# Patient Record
Sex: Male | Born: 1970 | Race: Black or African American | Hispanic: No | Marital: Married | Smoking: Current every day smoker
Health system: Southern US, Community
[De-identification: ages and names within clinical notes are randomized; demographics above are authoritative.]

## PROBLEM LIST (undated history)

## (undated) ENCOUNTER — Emergency Department (HOSPITAL_COMMUNITY): Admission: EM | Payer: No Typology Code available for payment source | Source: Home / Self Care

## (undated) DIAGNOSIS — M109 Gout, unspecified: Secondary | ICD-10-CM

---

## 2004-10-20 ENCOUNTER — Emergency Department (HOSPITAL_COMMUNITY): Admission: EM | Admit: 2004-10-20 | Discharge: 2004-10-21 | Payer: Self-pay | Admitting: Emergency Medicine

## 2004-12-20 ENCOUNTER — Ambulatory Visit: Payer: Self-pay | Admitting: Cardiovascular Disease

## 2004-12-20 ENCOUNTER — Ambulatory Visit (HOSPITAL_COMMUNITY): Admission: RE | Admit: 2004-12-20 | Discharge: 2004-12-20 | Payer: Self-pay | Admitting: Interventional Cardiology

## 2004-12-21 ENCOUNTER — Emergency Department (HOSPITAL_COMMUNITY): Admission: EM | Admit: 2004-12-21 | Discharge: 2004-12-21 | Payer: Self-pay | Admitting: Emergency Medicine

## 2010-03-31 ENCOUNTER — Encounter: Payer: Self-pay | Admitting: Interventional Cardiology

## 2011-04-26 ENCOUNTER — Emergency Department (HOSPITAL_COMMUNITY): Payer: Private Health Insurance - Indemnity

## 2011-04-26 ENCOUNTER — Emergency Department (HOSPITAL_COMMUNITY)
Admission: EM | Admit: 2011-04-26 | Discharge: 2011-04-26 | Disposition: A | Discharge status: Home / Self Care | Payer: Private Health Insurance - Indemnity | Attending: Emergency Medicine | Admitting: Emergency Medicine

## 2011-04-26 ENCOUNTER — Encounter (HOSPITAL_COMMUNITY): Payer: Self-pay | Admitting: Emergency Medicine

## 2011-04-26 ENCOUNTER — Ambulatory Visit (INDEPENDENT_AMBULATORY_CARE_PROVIDER_SITE_OTHER): Payer: PRIVATE HEALTH INSURANCE | Admitting: Family Medicine

## 2011-04-26 ENCOUNTER — Other Ambulatory Visit: Payer: Self-pay

## 2011-04-26 VITALS — BP 120/80 | HR 83 | Temp 99.2°F

## 2011-04-26 DIAGNOSIS — M25539 Pain in unspecified wrist: Secondary | ICD-10-CM

## 2011-04-26 DIAGNOSIS — R05 Cough: Secondary | ICD-10-CM

## 2011-04-26 DIAGNOSIS — M79609 Pain in unspecified limb: Secondary | ICD-10-CM | POA: Insufficient documentation

## 2011-04-26 DIAGNOSIS — M79602 Pain in left arm: Secondary | ICD-10-CM

## 2011-04-26 DIAGNOSIS — R61 Generalized hyperhidrosis: Secondary | ICD-10-CM

## 2011-04-26 DIAGNOSIS — R0602 Shortness of breath: Secondary | ICD-10-CM | POA: Insufficient documentation

## 2011-04-26 DIAGNOSIS — M2540 Effusion, unspecified joint: Secondary | ICD-10-CM

## 2011-04-26 DIAGNOSIS — F17200 Nicotine dependence, unspecified, uncomplicated: Secondary | ICD-10-CM | POA: Insufficient documentation

## 2011-04-26 DIAGNOSIS — R059 Cough, unspecified: Secondary | ICD-10-CM

## 2011-04-26 DIAGNOSIS — R110 Nausea: Secondary | ICD-10-CM

## 2011-04-26 DIAGNOSIS — M25439 Effusion, unspecified wrist: Secondary | ICD-10-CM | POA: Insufficient documentation

## 2011-04-26 LAB — BASIC METABOLIC PANEL
BUN: 10 mg/dL (ref 6–23)
CO2: 24 mEq/L (ref 19–32)
Calcium: 9.9 mg/dL (ref 8.4–10.5)
Chloride: 103 mEq/L (ref 96–112)
Creatinine, Ser: 0.68 mg/dL (ref 0.50–1.35)
GFR calc Af Amer: >90 mL/min (ref >90)
GFR calc non Af Amer: >90 mL/min (ref >90)
Glucose, Bld: 116 mg/dL — ABNORMAL HIGH (ref 70–99)
Potassium: 4.6 mEq/L (ref 3.5–5.1)
Sodium: 140 mEq/L (ref 135–145)

## 2011-04-26 LAB — CARDIAC PANEL(CRET KIN+CKTOT+MB+TROPI)
CK, MB: 2.1 ng/mL (ref 0.3–4.0)
Relative Index: 2.1 (ref 0.0–2.5)
Total CK: 102 U/L (ref 7–232)
Troponin I: <0.3 ng/mL (ref <0.30)

## 2011-04-26 LAB — CBC
HCT: 42.2 % (ref 39.0–52.0)
Hemoglobin: 15 g/dL (ref 13.0–17.0)
MCH: 29.8 pg (ref 26.0–34.0)
MCHC: 35.5 g/dL (ref 30.0–36.0)
MCV: 83.9 fL (ref 78.0–100.0)
Platelets: 296 10*3/uL (ref 150–400)
RBC: 5.03 MIL/uL (ref 4.22–5.81)
RDW: 13 % (ref 11.5–15.5)
WBC: 17.1 10*3/uL — ABNORMAL HIGH (ref 4.0–10.5)

## 2011-04-26 LAB — D-DIMER, QUANTITATIVE: D-Dimer, Quant: <0.22 ug/mL-FEU (ref 0.00–0.48)

## 2011-04-26 MED ORDER — LORAZEPAM 2 MG/ML IJ SOLN
2.0000 mg | Freq: Once | INTRAMUSCULAR | Status: AC
  Administered 2011-04-26: 2 mg via INTRAVENOUS
  Filled 2011-04-26: qty 1

## 2011-04-26 MED ORDER — HYDROMORPHONE HCL PF 1 MG/ML IJ SOLN
1.0000 mg | Freq: Once | INTRAMUSCULAR | Status: AC
  Administered 2011-04-26: 1 mg via INTRAVENOUS
  Filled 2011-04-26: qty 1

## 2011-04-26 MED ORDER — MORPHINE SULFATE 2 MG/ML IJ SOLN
2.0000 mg | Freq: Once | INTRAMUSCULAR | Status: AC
  Administered 2011-04-26: 2 mg via INTRAVENOUS
  Filled 2011-04-26: qty 1

## 2011-04-26 MED ORDER — HYDROMORPHONE BOLUS VIA INFUSION: 1.0000 mg | Freq: Once | INTRAVENOUS | Status: DC

## 2011-04-26 MED ORDER — SODIUM CHLORIDE 0.9 % IV BOLUS (SEPSIS)
1000.0000 mL | Freq: Once | INTRAVENOUS | Status: AC
  Administered 2011-04-26: 1000 mL via INTRAVENOUS

## 2011-04-26 MED ORDER — NAPROXEN 500 MG PO TABS: 500.0000 mg | ORAL_TABLET | Freq: Two times a day (BID) | ORAL | Status: DC | PRN

## 2011-04-26 MED ORDER — KETOROLAC TROMETHAMINE 15 MG/ML IJ SOLN
15.0000 mg | Freq: Once | INTRAMUSCULAR | Status: AC
  Filled 2011-04-26: qty 1

## 2011-04-26 MED ORDER — KETOROLAC TROMETHAMINE 30 MG/ML IJ SOLN
INTRAMUSCULAR | Status: AC
  Administered 2011-04-26: 15 mg
  Filled 2011-04-26: qty 1

## 2011-04-26 MED ORDER — GADOBENATE DIMEGLUMINE 529 MG/ML IV SOLN
20.0000 mL | Freq: Once | INTRAVENOUS | Status: AC
  Administered 2011-04-26: 20 mL via INTRAVENOUS

## 2011-04-26 NOTE — Progress Notes (Signed)
Orthopedic Tech Progress Note Patient Details:  Christian Taylor [birth year] [id number]  Other Ortho Devices Ortho Device Location: arm sling Ortho Device Interventions: Application   Cammer, Mickie Bail 04/26/2011, 2:50 PM

## 2011-04-26 NOTE — ED Provider Notes (Signed)
History     CSN: [id number]  Arrival date & time 04/26/11  4098   First MD Initiated Contact with Patient 04/26/11 [phone]      Chief Complaint  Patient presents with  . Arm Pain    left arm pain    HPI Patient says left arm pain began last night.  He had no specific injury to his arm.  When he woke up this morning, the arm was extremely painful.  He tried some naproxen and some Vicodin (left over from knee injury) without relief.  The pain was so bad he went to [facility].  Patient denies any new activities, injury to neck, arm, shoulder, wrist.   He does say he has been feeling slightly ill lately with cough and intermittent shortness of breath.  He thought this was from a cold or sinus infection and has not seen a doctor for this.    At Urgent care, the patient's ECG was concerning with ST-depressions, so they felt he should be evaluated for ACS in the ED as a cause of his left arm pain.  The patient denies chest pain.   History reviewed. No pertinent past medical history.  History reviewed. No pertinent past surgical history.  History reviewed. No pertinent family history.No history of early CAD.   History  Substance Use Topics  . Smoking status: Current Everyday Smoker -- 0.5 packs/day for 17 years    Types: Cigarettes  . Smokeless tobacco: Not on file  . Alcohol Use: Not on file      Review of Systems  Constitutional: Positive for diaphoresis. Negative for fever.  HENT: Positive for congestion. Negative for neck pain and neck stiffness.   Eyes: Negative for visual disturbance.  Respiratory: Positive for cough and shortness of breath.   Cardiovascular: Negative for chest pain, palpitations and leg swelling.  Gastrointestinal: Negative for abdominal pain.  Genitourinary: Negative for dysuria.  Musculoskeletal: Negative for joint swelling.  Skin: Negative for rash.  Neurological: Negative for dizziness.    Allergies  Review of patient's allergies indicates  no known allergies.  Home Medications   Current Outpatient Rx  Name Route Sig Dispense Refill  . HYDROCODONE-ACETAMINOPHEN 5-500 MG PO TABS Oral Take 1 tablet by mouth every 6 (six) hours as needed. pain    . NAPROXEN 500 MG PO TABS Oral Take 500 mg by mouth 2 (two) times daily as needed. pain      BP 139/73  Pulse 81  Resp 16  Ht 6\' 2"  (1.88 m)  Wt 243 lb (110.224 kg)  BMI 31.20 kg/m2  SpO2 99%  Physical Exam  Constitutional: He is oriented to person, place, and time. He appears well-developed and well-nourished. He appears distressed.  HENT:  Head: Normocephalic and atraumatic.  Eyes: EOM are normal. Pupils are equal, round, and reactive to light.  Neck: Normal range of motion. Neck supple.  Cardiovascular: Normal rate, regular rhythm and intact distal pulses.  Exam reveals friction rub. Exam reveals no gallop.   No murmur heard. Pulmonary/Chest: Effort normal and breath sounds normal. He has no rales.  Abdominal: Soft. Bowel sounds are normal. There is no tenderness.  Musculoskeletal:       Left arm with no visible deformity, swelling, erythema.  2+ pulses through out extremity.  Patient is severely tender to any palpation of wrist or elbow, or any movement of wrist or elbow.  Left shoulder without deformity, pt with some moderate tenderness to palpation, but he refuses to move it  full ROM.  Neck is non-tender to palpation without deformity.   Neurological: He is alert and oriented to person, place, and time.  Skin: Skin is warm. No rash noted.    ED Course  Procedures (including critical care time)  Date: 04/26/2011  Rate: 78  Rhythm: normal sinus rhythm  QRS Axis: normal  Intervals: normal  ST/T Wave abnormalities: ST depressions diffusely  Conduction Disutrbances:none  Narrative Interpretation: NSR with diffuse ST depressions, no old study available for comparision.   Old EKG Reviewed: none available   Labs Reviewed  CBC - Abnormal; Notable for the following:      WBC 17.1 (*)    All other components within normal limits  BASIC METABOLIC PANEL - Abnormal; Notable for the following:    Glucose, Bld 116 (*)    All other components within normal limits  CARDIAC PANEL(CRET KIN+CKTOT+MB+TROPI)  D-DIMER, QUANTITATIVE  BODY FLUID CULTURE   Dg Chest 2 View  04/26/2011  *RADIOLOGY REPORT*  Clinical Data: Cough, left arm pain  CHEST - 2 VIEW  Comparison: Chest x-ray of 10/20/2004  Findings: The lungs are clear.  Mediastinal contours are stable. The heart is within normal limits in size.  No bony abnormality is seen.  IMPRESSION: No active lung disease.  Original Report Authenticated By: Juline Patch, M.D.     1. Wrist pain   2. Joint effusion       MDM  Joint fluid has been sent for culture, however, Dr. Magnus Ivan, Orthopaedics, did not feel this was likely to be septic arthritis, and did not recommend antibiotics.  He advised pt being on scheduled naproxen and to follow up in his office.  This plan discussed with the patient, he voices understanding.         Ardyth Gal, MD 04/26/11 1432

## 2011-04-26 NOTE — Discharge Instructions (Signed)
Arthralgia Your caregiver has diagnosed you as suffering from an arthralgia. Arthralgia means there is pain in a joint. This can come from many reasons including:  Bruising the joint which causes soreness (inflammation) in the joint.   Wear and tear on the joints which occur as we grow older (osteoarthritis).   Overusing the joint.   Various forms of arthritis.   Infections of the joint.  Regardless of the cause of pain in your joint, most of these different pains respond to anti-inflammatory drugs and rest. The exception to this is when a joint is infected, and these cases are treated with antibiotics, if it is a bacterial infection. HOME CARE INSTRUCTIONS   Rest the injured area for as long as directed by your caregiver. Then slowly start using the joint as directed by your caregiver and as the pain allows. Crutches as directed may be useful if the ankles, knees or hips are involved. If the knee was splinted or casted, continue use and care as directed. If an stretchy or elastic wrapping bandage has been applied today, it should be removed and re-applied every 3 to 4 hours. It should not be applied tightly, but firmly enough to keep swelling down. Watch toes and feet for swelling, bluish discoloration, coldness, numbness or excessive pain. If any of these problems (symptoms) occur, remove the ace bandage and re-apply more loosely. If these symptoms persist, contact your caregiver or return to this location.   For the first 24 hours, keep the injured extremity elevated on pillows while lying down.   Apply ice for 15 to 20 minutes to the sore joint every couple hours while awake for the first half day. Then 3 to 4 times per day for the first 48 hours. Put the ice in a plastic bag and place a towel between the bag of ice and your skin.   Wear any splinting, casting, elastic bandage applications, or slings as instructed.   Only take over-the-counter or prescription medicines for pain,  discomfort, or fever as directed by your caregiver. Do not use aspirin immediately after the injury unless instructed by your physician. Aspirin can cause increased bleeding and bruising of the tissues.   If you were given crutches, continue to use them as instructed and do not resume weight bearing on the sore joint until instructed.  Persistent pain and inability to use the sore joint as directed for more than 2 to 3 days are warning signs indicating that you should see a caregiver for a follow-up visit as soon as possible. Initially, a hairline fracture (break in bone) may not be evident on X-rays. Persistent pain and swelling indicate that further evaluation, non-weight bearing or use of the joint (use of crutches or slings as instructed), or further X-rays are indicated. X-rays may sometimes not show a small fracture until a week or 10 days later. Make a follow-up appointment with your own caregiver or one to whom we have referred you. A radiologist (specialist in reading X-rays) may read your X-rays. Make sure you know how you are to obtain your X-ray results. Do not assume everything is normal if you do not hear from us. SEEK MEDICAL CARE IF: Bruising, swelling, or pain increases. SEEK IMMEDIATE MEDICAL CARE IF:   Your fingers or toes are numb or blue.   The pain is not responding to medications and continues to stay the same or get worse.   The pain in your joint becomes severe.   You develop a fever over   102 F (38.9 C).   It becomes impossible to move or use the joint.  MAKE SURE YOU:   Understand these instructions.   Will watch your condition.   Will get help right away if you are not doing well or get worse.  Document Released: 02/25/2005 Document Revised: 11/07/2010 Document Reviewed: 10/14/2007 [employer] Patient Information 2012 [employer]. 

## 2011-04-26 NOTE — ED Provider Notes (Signed)
I saw and evaluated the patient, reviewed the resident's note and I agree with the findings and plan.  I personally reviewed the pt's EKG.  EKG:  Rhythm: Normal sinus Rate: 70  Axis: Normal axis Intervals: Normal. LVH based on QRS amplitude of greater than 11 mm in aVL.  ST segments:Nonspecific ST changes. Comparison: None   41 year old male with left arm pain. No history of trauma. Onset pain was last night gradually worsened throughout the day today. Denies history of trauma. No fevers or chills. Pain is worse with movement. Denies significant pain anywhere else. No history similar symptoms. Patient was seen in urgent care and referred to ED because of EKG and apparently concern for anginal equivalent. Pt with abnormal EKG but very low suspicion that etiology of symptoms is cardiac. Trop normal. Exquisite pain with ROM of wrist with passive ROM. No hx of trauma and not immunocompromised but concern for possible infection. WBC 17. Pt really has no good reason to have infection of his arm though. Will MRI for further eval. Consider DVT but d-dimer normal and wouldn't expect pain to this degree from blood clot.   2:25 PM Discuss case with Dr. Magnus Ivan, orthopedics. States that if clinical suspicion for septic arthritis is not that high and he displayed patient on NSAIDs and then follow up in the office early next week. Patient is afebrile and well appearing.  The patient's wrist was tapped. Was only able to obtain about a half cc of fluid which was sent for culture. Unfortunately is not enough fluid for cell count. The fluid was clear and viscous consistent with normal synovial fluid.  Procedure note:  Procedure: arthrocentesis L wrist  Consent: verbal consent was provided by patient  Prep: dorsal aspect was prepped in usual fashion  Location: Dorsal aspect L wrist with lister's tubercle and anatomic snuff box used as landmarks.  Anesthesia: 2cc of 2% lidocaine with epi  Fluid: ~0.5cc of  clear, viscous fluid obtained  Pt tolerated procedure well with no immediate complications.   Raeford Razor, MD 05/05/11 2224

## 2011-04-26 NOTE — ED Notes (Signed)
Pt states left arm pain began yesterday, states it hurts upon any movement.  Pt denies any trauma or injury.

## 2011-04-26 NOTE — ED Notes (Signed)
Pt coming from [facility]. Pt was given 324 asa there.

## 2011-04-26 NOTE — Progress Notes (Signed)
  Subjective:    Patient ID: Christian Taylor, male    DOB: 09-May-1970, 41 y.o.   MRN: [id number]  HPI Christian Taylor presents emergently this morning with Left arm pain and numbness that began yesterday am and worsened this morning. He states he felt he was going to pass out. Has had nausea for two days.  The pain is described as excruciating, with extreme tenderness in the hand and forearm all the way to the shoulder.  There is no neck pain or difficulty moving the neck.  He has no headache or leg pains or pleuritic chest pain.  There is no abdominal pain  Patient has not been feeling well for three weeks.  He has been coughing, although having no fever, and slightly short of breath.  For the past two days, he has lost his appetite and felt nauseated.  He was awoken yesterday with the left arm pain and tenderness from hand to shoulder, and this pain has progressively worsened.  He notes that he had a similar set of symptoms 7 years ago for which he was completely worked up cardiac wise and was neg.    He has 3 bothers and 1 sister, none of whom have cardiac hx.  His dad died of cirrhosis without heart disease.  Smoker.  Review of Systems No vomiting, pleuritic or other chest pain, significant SOB at present, leg pain    Objective:   Physical Exam Clearly uncomfortable and diaphoretic when brought back.  His BP on admission was under 100 systolic.  He appeared shocky After being moved to the gurney, patient BP improved.  He cries in pain with any pressure or movement to the left arm.  He moves the left arm slowly and carefully, Cannot make a tight fist. HEENT:  Unremarkable Neck full ROM Chest shows left sided ronchi Heart:  Reg, no murmur or gallop Abd:  Soft, nontender with no masses Ext:  Very tender left hand and arm, otherwise the other 3 extrem move well with good strength Neuro:  Alert, appropriate and cooperative  CN III-XII intact  Motor as described above  Sensory:  Hyperesthesia  LUE  EKG:  ST inversions in II and III    Assessment & Plan:  Unusual recurrence of extreme L arm pain and dysesthesia.  Has risk factors for CAD (male, smoking), but has had neg cardiac workup in past.  Features of neuropathic pain, but with diaphoresis, hypotension and abnl EKG, need to reevaluate for cardiac disease.  Plan: ASA 81mg  x 4 O2, IV and immedicate transport.  Dr. Elbert Ewings spoke with Tresa Endo at [facility] ED.

## 2011-04-26 NOTE — ED Notes (Signed)
Per ems-  Since yesterday pt has been having left arm pain.  Pt has had episodes of dyspnea and diaphoresis. Pt states left arm pain radiates to his shoulder but states no chest pain.

## 2011-04-29 MED ORDER — GADOBENATE DIMEGLUMINE 529 MG/ML IV SOLN
20.0000 mL | Freq: Once | INTRAVENOUS | Status: AC
  Administered 2011-04-29: 20 mL via INTRAVENOUS

## 2011-04-30 LAB — BODY FLUID CULTURE
Culture: NO GROWTH
Gram Stain: NONE SEEN

## 2011-05-05 NOTE — ED Provider Notes (Signed)
Medical screening examination/treatment/procedure(s) were conducted as a shared visit with non-physician practitioner(s) and myself.  I personally evaluated the patient during the encounter.  Please see completed note for this encounter.  Raeford Razor, MD 05/05/11 2225

## 2011-05-08 ENCOUNTER — Encounter: Payer: Self-pay | Admitting: Family Medicine

## 2014-11-15 ENCOUNTER — Other Ambulatory Visit: Payer: Self-pay | Admitting: Family Medicine

## 2014-11-15 ENCOUNTER — Ambulatory Visit
Admission: RE | Admit: 2014-11-15 | Discharge: 2014-11-15 | Disposition: A | Discharge status: Home / Self Care | Payer: BLUE CROSS/BLUE SHIELD | Source: Ambulatory Visit | Attending: Family Medicine | Admitting: Family Medicine

## 2014-11-15 DIAGNOSIS — R05 Cough: Secondary | ICD-10-CM

## 2014-11-15 DIAGNOSIS — R059 Cough, unspecified: Secondary | ICD-10-CM

## 2015-09-17 ENCOUNTER — Emergency Department (HOSPITAL_COMMUNITY)
Admission: EM | Admit: 2015-09-17 | Discharge: 2015-09-17 | Disposition: A | Discharge status: Home / Self Care | Payer: No Typology Code available for payment source | Attending: Emergency Medicine | Admitting: Emergency Medicine

## 2015-09-17 ENCOUNTER — Emergency Department (HOSPITAL_COMMUNITY): Payer: No Typology Code available for payment source

## 2015-09-17 ENCOUNTER — Encounter (HOSPITAL_COMMUNITY): Payer: Self-pay

## 2015-09-17 DIAGNOSIS — F17210 Nicotine dependence, cigarettes, uncomplicated: Secondary | ICD-10-CM | POA: Diagnosis not present

## 2015-09-17 DIAGNOSIS — Z79899 Other long term (current) drug therapy: Secondary | ICD-10-CM | POA: Insufficient documentation

## 2015-09-17 DIAGNOSIS — Y9389 Activity, other specified: Secondary | ICD-10-CM | POA: Insufficient documentation

## 2015-09-17 DIAGNOSIS — M25532 Pain in left wrist: Secondary | ICD-10-CM | POA: Diagnosis not present

## 2015-09-17 DIAGNOSIS — Y92410 Unspecified street and highway as the place of occurrence of the external cause: Secondary | ICD-10-CM | POA: Diagnosis not present

## 2015-09-17 DIAGNOSIS — Y999 Unspecified external cause status: Secondary | ICD-10-CM | POA: Insufficient documentation

## 2015-09-17 HISTORY — DX: Gout, unspecified: M10.9

## 2015-09-17 MED ORDER — NAPROXEN 250 MG PO TABS: 250.0000 mg | ORAL_TABLET | Freq: Two times a day (BID) | ORAL | Status: DC
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
## 2017-05-12 ENCOUNTER — Encounter (HOSPITAL_COMMUNITY): Payer: Self-pay | Admitting: Emergency Medicine

## 2017-05-12 ENCOUNTER — Encounter (HOSPITAL_COMMUNITY)
Admission: EM | Disposition: A | Discharge status: Home / Self Care | Payer: Self-pay | Source: Home / Self Care | Attending: Emergency Medicine

## 2017-05-12 ENCOUNTER — Emergency Department (HOSPITAL_COMMUNITY): Payer: BLUE CROSS/BLUE SHIELD

## 2017-05-12 ENCOUNTER — Ambulatory Visit (HOSPITAL_COMMUNITY): Admit: 2017-05-12 | Payer: BLUE CROSS/BLUE SHIELD | Admitting: Cardiovascular Disease

## 2017-05-12 ENCOUNTER — Other Ambulatory Visit: Payer: Self-pay

## 2017-05-12 ENCOUNTER — Emergency Department (HOSPITAL_COMMUNITY)
Admission: EM | Admit: 2017-05-12 | Discharge: 2017-05-12 | Disposition: A | Discharge status: Home / Self Care | Payer: BLUE CROSS/BLUE SHIELD | Attending: Emergency Medicine | Admitting: Emergency Medicine

## 2017-05-12 DIAGNOSIS — R55 Syncope and collapse: Secondary | ICD-10-CM | POA: Insufficient documentation

## 2017-05-12 DIAGNOSIS — Z79899 Other long term (current) drug therapy: Secondary | ICD-10-CM | POA: Diagnosis not present

## 2017-05-12 DIAGNOSIS — F17210 Nicotine dependence, cigarettes, uncomplicated: Secondary | ICD-10-CM | POA: Diagnosis not present

## 2017-05-12 DIAGNOSIS — R079 Chest pain, unspecified: Secondary | ICD-10-CM | POA: Insufficient documentation

## 2017-05-12 DIAGNOSIS — I200 Unstable angina: Secondary | ICD-10-CM | POA: Insufficient documentation

## 2017-05-12 LAB — COMPREHENSIVE METABOLIC PANEL
ALT: 33 U/L (ref 17–63)
AST: 32 U/L (ref 15–41)
Albumin: 4.4 g/dL (ref 3.5–5.0)
Alkaline Phosphatase: 63 U/L (ref 38–126)
Anion gap: 11 (ref 5–15)
BUN: 14 mg/dL (ref 6–20)
CO2: 19 mmol/L — ABNORMAL LOW (ref 22–32)
Calcium: 9.6 mg/dL (ref 8.9–10.3)
Chloride: 108 mmol/L (ref 101–111)
Creatinine, Ser: 0.98 mg/dL (ref 0.61–1.24)
GFR calc Af Amer: >60 mL/min (ref >60)
GFR calc non Af Amer: >60 mL/min (ref >60)
Glucose, Bld: 135 mg/dL — ABNORMAL HIGH (ref 65–99)
Potassium: 3.9 mmol/L (ref 3.5–5.1)
Sodium: 138 mmol/L (ref 135–145)
Total Bilirubin: 0.9 mg/dL (ref 0.3–1.2)
Total Protein: 7.9 g/dL (ref 6.5–8.1)

## 2017-05-12 LAB — CBC WITH DIFFERENTIAL/PLATELET
Basophils Absolute: 0 10*3/uL (ref 0.0–0.1)
Basophils Relative: 0 %
Eosinophils Absolute: 0.1 10*3/uL (ref 0.0–0.7)
Eosinophils Relative: 1 %
HCT: 46.3 % (ref 39.0–52.0)
Hemoglobin: 15.9 g/dL (ref 13.0–17.0)
Lymphocytes Relative: 8 %
Lymphs Abs: 1 10*3/uL (ref 0.7–4.0)
MCH: 29.9 pg (ref 26.0–34.0)
MCHC: 34.3 g/dL (ref 30.0–36.0)
MCV: 87.2 fL (ref 78.0–100.0)
Monocytes Absolute: 0.4 10*3/uL (ref 0.1–1.0)
Monocytes Relative: 3 %
Neutro Abs: 12 10*3/uL — ABNORMAL HIGH (ref 1.7–7.7)
Neutrophils Relative %: 88 %
Platelets: 263 10*3/uL (ref 150–400)
RBC: 5.31 MIL/uL (ref 4.22–5.81)
RDW: 13.3 % (ref 11.5–15.5)
WBC: 13.6 10*3/uL — ABNORMAL HIGH (ref 4.0–10.5)

## 2017-05-12 LAB — LIPID PANEL
Cholesterol: 150 mg/dL (ref 0–200)
HDL: 48 mg/dL (ref >40)
LDL Cholesterol: 81 mg/dL (ref 0–99)
Total CHOL/HDL Ratio: 3.1 RATIO
Triglycerides: 104 mg/dL (ref <150)
VLDL: 21 mg/dL (ref 0–40)

## 2017-05-12 LAB — I-STAT TROPONIN, ED
Troponin i, poc: 0 ng/mL (ref 0.00–0.08)
Troponin i, poc: 0 ng/mL (ref 0.00–0.08)

## 2017-05-12 LAB — PROTIME-INR
INR: 1
Prothrombin Time: 13.1 seconds (ref 11.4–15.2)

## 2017-05-12 LAB — APTT: aPTT: 35 seconds (ref 24–36)

## 2017-05-12 LAB — D-DIMER, QUANTITATIVE (NOT AT ARMC): D DIMER QUANT: 0.36 ug{FEU}/mL (ref 0.00–0.50)

## 2017-05-12 LAB — TROPONIN I: Troponin I: <0.03 ng/mL (ref <0.03)

## 2017-05-12 SURGERY — LEFT HEART CATH AND CORONARY ANGIOGRAPHY
Anesthesia: LOCAL

## 2017-05-12 MED ORDER — IOPAMIDOL (ISOVUE-370) INJECTION 76%
INTRAVENOUS | Status: AC
  Administered 2017-05-12: 100 mL
  Filled 2017-05-12: qty 100

## 2017-05-12 MED ORDER — SODIUM CHLORIDE 0.9 % IV SOLN
INTRAVENOUS | Status: DC
  Administered 2017-05-12: 10 mL/h via INTRAVENOUS

## 2017-05-12 MED ORDER — HEPARIN BOLUS VIA INFUSION
4000.0000 [IU] | Freq: Once | INTRAVENOUS | Status: AC
  Administered 2017-05-12: 4000 [IU] via INTRAVENOUS
  Filled 2017-05-12: qty 4000

## 2017-05-12 MED ORDER — HEPARIN (PORCINE) IN NACL 100-0.45 UNIT/ML-% IJ SOLN
1350.0000 [IU]/h | INTRAMUSCULAR | Status: DC
  Administered 2017-05-12: 1350 [IU]/h via INTRAVENOUS
  Filled 2017-05-12: qty 250

## 2017-05-12 NOTE — Progress Notes (Signed)
ANTICOAGULATION CONSULT NOTE - Initial Consult  Pharmacy Consult for heparin Indication: chest pain/ACS  No Known Allergies  Patient Measurements: Height: 6' 1.5" (186.7 cm) Weight: 254 lb (115.2 kg) IBW/kg (Calculated) : 81.05 Heparin Dosing Weight: 105 Kg  Vital Signs: Temp: 98.6 F (37 C) (03/04 0742) Temp Source: Temporal (03/04 0742) BP: 102/83 (03/04 0800) Pulse Rate: 107 (03/04 0800)  Labs: Recent Labs    05/12/17 0731  HGB 15.9  HCT 46.3  PLT 263  APTT 35  LABPROT 13.1  INR 1.00  CREATININE 0.98  TROPONINI <0.03    Estimated Creatinine Clearance: 128.8 mL/min (by C-G formula based on SCr of 0.98 mg/dL).   Medical History: Past Medical History:  Diagnosis Date  . Gout    Assessment: [age] yoM brought in as a code STEMI. Patient reports no anticoagulation PTA; Pt reports falling ~ 1 week ago, STAT CT head ordered to r/o bleed. CBC stable  Goal of Therapy:  Heparin level 0.3-0.7 units/ml Monitor platelets by anticoagulation protocol: Yes   Plan:  Give 4000 units bolus x 1 Start heparin infusion at 1350 units/hr Check anti-Xa level in 6 hours and daily while on heparin Continue to monitor H&H and platelets  Divit Stipp L Yulian Gosney 05/12/2017,8:55 AM

## 2017-05-12 NOTE — Consult Note (Signed)
Cardiology Consultation:   Patient ID: Christian Taylor; [id number]; [birth year]   Admit date: 05/12/2017 Date of Consult: 05/12/2017  Primary Care Provider: No primary care provider on file. Primary Cardiologist: No primary care provider on file.    Patient Profile:   Christian Taylor is a 47 y.o. male with a hx of tobacco abuse who is being seen today for the evaluation of chest pain at the request of Dr Wilson Singer.  History of Present Illness:   Mr. Christian Taylor is a 47 year old male who presents with chest discomfort.  The patient describes left-sided chest pressure and pain with breathing that started at 2:00 this morning.  There is associated shortness of breath.  He has had some diaphoresis.  No nausea or vomiting.  He had symptoms that persisted through the night and he called EMS this morning.  Upon EMS arrival an EKG showed inferolateral ST depression and subtle anterior ST elevation.  A code STEMI is called.  After review of the EKG I did not feel it met diagnostic criteria and I met the patient in the emergency department to evaluate him.  At the time of my evaluation the patient is chest pain-free.  He has received 2 sublingual nitroglycerin by EMS.  The patient has not felt well for the last few days.  He denies visual changes, headache, orthopnea, or PND.  He has had no heart palpitations.  He reports a similar episode about 8-10 years ago with improvement after receiving nitroglycerin.  It sounds like he had a stress test at that time with no significant abnormalities found.  He has had no intercurrent cardiac problems.  Past Medical History:  Diagnosis Date  . Gout     History reviewed. No pertinent surgical history.   Home Medications:  Prior to Admission medications   Medication Sig Start Date End Date Taking? Authorizing Provider  HYDROcodone-acetaminophen (VICODIN) 5-500 MG per tablet Take 1 tablet by mouth every 6 (six) hours as needed. pain    [provider]  naproxen  (NAPROSYN) 250 MG tablet Take 1 tablet (250 mg total) by mouth 2 (two) times daily with a meal. 09/17/15   Christian Pean, PA-C    Inpatient Medications: Scheduled Meds:  Continuous Infusions: . sodium chloride 10 mL/hr (05/12/17 0901)  . heparin 1,350 Units/hr (05/12/17 0906)   PRN Meds:   Allergies:   No Known Allergies  Social History:   Social History   Socioeconomic History  . Marital status: Married    Spouse name: Not on file  . Number of children: Not on file  . Years of education: Not on file  . Highest education level: Not on file  Social Needs  . Financial resource strain: Not on file  . Food insecurity - worry: Not on file  . Food insecurity - inability: Not on file  . Transportation needs - medical: Not on file  . Transportation needs - non-medical: Not on file  Occupational History  . Not on file  Tobacco Use  . Smoking status: Current Every Day Smoker    Packs/day: 0.50    Years: 17.00    Pack years: 8.50    Types: Cigarettes  . Smokeless tobacco: Never Used  Substance and Sexual Activity  . Alcohol use: Yes  . Drug use: Yes    Types: Marijuana    Comment: occasionally  . Sexual activity: Not on file  Other Topics Concern  . Not on file  Social History Narrative  . Not on file  Family History:   The patient's family history is negative for coronary artery disease in any siblings or parents.  ROS:  Please see the history of present illness.   All other ROS reviewed and negative.     Physical Exam/Data:   Vitals:   05/12/17 0742 05/12/17 0743 05/12/17 0800  BP: (!) 126/95  102/83  Pulse: 100  (!) 107  Resp: 17  (!) 29  Temp: 98.6 F (37 C)    TempSrc: Temporal    SpO2: 100%  98%  Weight:  254 lb (115.2 kg)   Height:  6' 1.5" (1.867 m)    No intake or output data in the 24 hours ending 05/12/17 0913 Filed Weights   05/12/17 0743  Weight: 254 lb (115.2 kg)   Body mass index is 33.06 kg/m.  General:  Well nourished, well  developed, in no acute distress HEENT: normal Lymph: no adenopathy Neck: no JVD Endocrine:  No thryomegaly Vascular: No carotid bruits; FA pulses 2+ bilaterally  Cardiac:  normal S1, S2; RRR; no murmur tachycardic Lungs:  clear to auscultation bilaterally, no wheezing, rhonchi or rales  Abd: soft, nontender, no hepatomegaly  Ext: no edema Musculoskeletal:  No deformities, BUE and BLE strength normal and equal Skin: warm and dry  Neuro:  CNs 2-12 intact, no focal abnormalities noted Psych:  Normal affect   EKG:  The EKG was personally reviewed and demonstrates: Sinus tachycardia with inferolateral ST/T changes consider repolarization abnormality versus inferolateral ischemia  Telemetry:  Telemetry was personally reviewed and demonstrates: Sinus rhythm/sinus tachycardia  Laboratory Data:  Chemistry Recent Labs  Lab 05/12/17 0731  NA 138  K 3.9  CL 108  CO2 19*  GLUCOSE 135*  BUN 14  CREATININE 0.98  CALCIUM 9.6  GFRNONAA >60  GFRAA >60  ANIONGAP 11    Recent Labs  Lab 05/12/17 0731  PROT 7.9  ALBUMIN 4.4  AST 32  ALT 33  ALKPHOS 63  BILITOT 0.9   Hematology Recent Labs  Lab 05/12/17 0731  WBC 13.6*  RBC 5.31  HGB 15.9  HCT 46.3  MCV 87.2  MCH 29.9  MCHC 34.3  RDW 13.3  PLT 263   Cardiac Enzymes Recent Labs  Lab 05/12/17 0731  TROPONINI <0.03    Recent Labs  Lab 05/12/17 0735  TROPIPOC 0.00    BNPNo results for input(s): BNP, PROBNP in the last 168 hours.  DDimer No results for input(s): DDIMER in the last 168 hours.  Radiology/Studies:  Ct Head Wo Contrast  Result Date: 05/12/2017 CLINICAL DATA:  Syncope 2 days ago, fell and hit head. EXAM: CT HEAD WITHOUT CONTRAST TECHNIQUE: Contiguous axial images were obtained from the base of the skull through the vertex without intravenous contrast. COMPARISON:  None. FINDINGS: Brain: Ventricles are normal in size and configuration. All areas of the brain demonstrate normal gray-white matter  differentiation. There is no mass, hemorrhage, edema or other evidence of acute parenchymal abnormality. No extra-axial hemorrhage. Vascular: No hyperdense vessel or unexpected calcification. Skull: Normal. Negative for fracture or focal lesion. Sinuses/Orbits: No acute finding. Chronic appearing mucosal thickening within the left maxillary sinus. Other: None. IMPRESSION: 1. No acute findings. No intracranial mass, hemorrhage or edema. No skull fracture. 2. Chronic appearing left maxillary sinus disease. Electronically Signed   By: Franki Cabot M.D.   On: 05/12/2017 08:17   Dg Chest Portable 1 View  Result Date: 05/12/2017 CLINICAL DATA:  Chest pain EXAM: PORTABLE CHEST 1 VIEW COMPARISON:  11/15/2014 FINDINGS: Heart  and mediastinal contours are within normal limits. No focal opacities or effusions. No acute bony abnormality. IMPRESSION: No active disease. Electronically Signed   By: Rolm Baptise M.D.   On: 05/12/2017 08:08    Assessment and Plan:   1. Chest pain of uncertain etiology intermediate probability of ACS 2. Syncope 3. Tobacco abuse 4. Hypertension  The patient's current EKG is compared to previous.  There is subtle change with a more prominent T wave anteriorly, but his inferolateral ST depression and T wave abnormality is unchanged.  I suspect he has LVH with repolarization changes related to long-standing hypertension.  We will start him on IV heparin, cycle cardiac enzymes, and he will continue to undergo evaluation for the problems outlined above.  He has had a head CT because of syncope and this is normal without acute changes.  I am going to add a d-dimer to his labs since he is tachycardic with syncope.  It is also possible that he has a viral illness and I will defer to the primary service for evaluating other potential etiologies of his symptoms.  We will check an echocardiogram and follow his markers.  We will follow along with you.  If he has recurrent chest pain or dynamic EKG  changes we will move forward with urgent cardiac catheterization, otherwise will allow for his workup to be completed.   For questions or updates, please contact [place] Please consult [email or web address] for contact info under Cardiology/STEMI.   Signed, Sherren Mocha, MD  05/12/2017 9:13 AM

## 2017-05-12 NOTE — Progress Notes (Signed)
   05/12/17 0730  Clinical Encounter Type  Visited With Patient and family together  Visit Type Spiritual support  Referral From Nurse  Spiritual Encounters  Spiritual Needs Prayer   Visited with Christian Taylor, his wife and another family member as they somewhat anxiously awaited being able to go back to the cath lab. Prayed with the couple.

## 2017-05-12 NOTE — ED Provider Notes (Addendum)
[facility]   CSN: [id number] Arrival date & time: 05/12/17  0725     History   Chief Complaint Chief Complaint  Patient presents with  . Code STEMI    HPI Christian BoschChristopher Taylor is a 47 y.o. male.  HPI  47 year old male with chest pain.  Symptom onset around 2:00 this morning.  Left-sided pain/pressure.  Worse with deep breathing.  Associated with diaphoresis.  No nausea, vomiting or palpitations.  Symptoms were persistent throughout the night so he called EMS upon waking this morning.  EKG for EMS showed some ST segment elevation in the anterior precordial leads with some depression inferiorly.  He was made a code STEMI prehospital.  He was given to sublingual nitroglycerin and arrived to the emergency room symptom-free.  Past Medical History:  Diagnosis Date  . Gout     There are no active problems to display for this patient.   History reviewed. No pertinent surgical history.     Home Medications    Prior to Admission medications   Medication Sig Start Date End Date Taking? Authorizing Provider  HYDROcodone-acetaminophen (VICODIN) 5-500 MG per tablet Take 1 tablet by mouth every 6 (six) hours as needed. pain    [provider]  naproxen (NAPROSYN) 250 MG tablet Take 1 tablet (250 mg total) by mouth 2 (two) times daily with a meal. 09/17/15   Everlene Farrieransie, William, PA-C    Family History No family history on file.  Social History Social History   Tobacco Use  . Smoking status: Current Every Day Smoker    Packs/day: 0.50    Years: 17.00    Pack years: 8.50    Types: Cigarettes  . Smokeless tobacco: Never Used  Substance Use Topics  . Alcohol use: Yes  . Drug use: Yes    Types: Marijuana    Comment: occasionally     Allergies   Patient has no known allergies.   Review of Systems Review of Systems All systems reviewed and negative, other than as noted in HPI.   Physical Exam Updated Vital  Signs BP (!) 126/95 (BP Location: Right Arm)   Pulse 100   Temp 98.6 F (37 C) (Temporal)   Resp 17   Ht 6' 1.5" (1.867 m)   Wt 115.2 kg (254 lb)   SpO2 100%   BMI 33.06 kg/m   Physical Exam  Constitutional: He appears well-developed and well-nourished. No distress.  HENT:  Head: Normocephalic and atraumatic.  Eyes: Conjunctivae are normal. Right eye exhibits no discharge. Left eye exhibits no discharge.  Neck: Neck supple.  Cardiovascular: Normal rate, regular rhythm and normal heart sounds. Exam reveals no gallop and no friction rub.  No murmur heard. Pulmonary/Chest: Effort normal and breath sounds normal. No respiratory distress.  Abdominal: Soft. He exhibits no distension. There is no tenderness.  Musculoskeletal: He exhibits no edema or tenderness.  Neurological: He is alert.  Skin: Skin is warm and dry.  Psychiatric: He has a normal mood and affect. His behavior is normal. Thought content normal.  Nursing note and vitals reviewed.    ED Treatments / Results  Labs (all labs ordered are listed, but only abnormal results are displayed) Labs Reviewed  CBC WITH DIFFERENTIAL/PLATELET - Abnormal; Notable for the following components:      Result Value   WBC 13.6 (*)    Neutro Abs 12.0 (*)    All other components within normal limits  COMPREHENSIVE METABOLIC PANEL - Abnormal; Notable  for the following components:   CO2 19 (*)    Glucose, Bld 135 (*)    All other components within normal limits  PROTIME-INR  APTT  TROPONIN I  LIPID PANEL  D-DIMER, QUANTITATIVE (NOT AT [facility])  I-STAT TROPONIN, ED  I-STAT TROPONIN, ED  I-STAT TROPONIN, ED    EKG  EKG Interpretation  Date/Time:  Monday May 12 2017 07:27:49 EST Ventricular Rate:  101 PR Interval:    QRS Duration: 90 QT Interval:  375 QTC Calculation: 487 R Axis:   17 Text Interpretation:  Sinus tachycardia Probable left atrial enlargement Consider anterior infarct Minimal ST depression, inferior leads  Baseline wander in lead(s) V3 V5 Confirmed by Raeford Razor [phone]) on 05/12/2017 8:21:06 AM       Radiology Dg Chest Portable 1 View  Result Date: 05/12/2017 CLINICAL DATA:  Chest pain EXAM: PORTABLE CHEST 1 VIEW COMPARISON:  11/15/2014 FINDINGS: Heart and mediastinal contours are within normal limits. No focal opacities or effusions. No acute bony abnormality. IMPRESSION: No active disease. Electronically Signed   By: Charlett Nose M.D.   On: 05/12/2017 08:08    Procedures Procedures (including critical care time)  Medications Ordered in ED Medications  0.9 %  sodium chloride infusion (not administered)     Initial Impression / Assessment and Plan / ED Course  I have reviewed the triage vital signs and the nursing notes.  Pertinent labs & imaging results that were available during my care of the patient were reviewed by me and considered in my medical decision making (see chart for details).     46yM with CP. Made "Code STEMI" pre-hospital. Upon review of prior EKG, he actually had TWI in inferior/lateral precordial leads previously.He was evaluated by cardiology upon arriving to the ED. Decision made to not take him directly to cath lab though.   He remained symptom-free in the emergency room.  Repeat troponin stay normal.  He had a CT angiography of his chest did not show any PE or other acute abnormality.  Felt to be low risk with regards to his chest pain he was discharged with return precautions and the need for outpatient follow-up.    Final Clinical Impressions(s) / ED Diagnoses   Final diagnoses:  Chest pain, unspecified type    ED Discharge Orders    None       Raeford Razor, MD 05/13/17 1415    Raeford Razor, MD 05/13/17 1416

## 2017-05-12 NOTE — ED Notes (Signed)
Pt transported to CT ?

## 2017-05-12 NOTE — ED Notes (Signed)
Pt is supposed to wear a C-Pap at night-- has not been wearing it "for awhile" .
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
## 2019-02-01 ENCOUNTER — Other Ambulatory Visit: Payer: Self-pay

## 2019-02-01 DIAGNOSIS — Z20822 Contact with and (suspected) exposure to covid-19: Secondary | ICD-10-CM

## 2019-02-02 LAB — NOVEL CORONAVIRUS, NAA: SARS-CoV-2, NAA: NOT DETECTED

## 2019-04-09 ENCOUNTER — Ambulatory Visit: Payer: BC Managed Care – PPO | Attending: Internal Medicine

## 2019-04-09 DIAGNOSIS — Z20822 Contact with and (suspected) exposure to covid-19: Secondary | ICD-10-CM | POA: Insufficient documentation

## 2019-04-10 LAB — NOVEL CORONAVIRUS, NAA: SARS-CoV-2, NAA: NOT DETECTED

## 2019-09-21 ENCOUNTER — Encounter (HOSPITAL_COMMUNITY): Payer: Self-pay | Admitting: Emergency Medicine

## 2019-09-21 ENCOUNTER — Emergency Department (HOSPITAL_COMMUNITY): Payer: BC Managed Care – PPO

## 2019-09-21 ENCOUNTER — Emergency Department (HOSPITAL_COMMUNITY)
Admission: EM | Admit: 2019-09-21 | Discharge: 2019-09-21 | Disposition: A | Discharge status: Home / Self Care | Payer: BC Managed Care – PPO | Attending: Emergency Medicine | Admitting: Emergency Medicine

## 2019-09-21 DIAGNOSIS — R05 Cough: Secondary | ICD-10-CM | POA: Diagnosis not present

## 2019-09-21 DIAGNOSIS — R030 Elevated blood-pressure reading, without diagnosis of hypertension: Secondary | ICD-10-CM | POA: Diagnosis not present

## 2019-09-21 DIAGNOSIS — R42 Dizziness and giddiness: Secondary | ICD-10-CM | POA: Diagnosis not present

## 2019-09-21 DIAGNOSIS — R519 Headache, unspecified: Secondary | ICD-10-CM | POA: Insufficient documentation

## 2019-09-21 DIAGNOSIS — Z79899 Other long term (current) drug therapy: Secondary | ICD-10-CM | POA: Diagnosis not present

## 2019-09-21 DIAGNOSIS — F17210 Nicotine dependence, cigarettes, uncomplicated: Secondary | ICD-10-CM | POA: Diagnosis not present

## 2019-09-21 LAB — URINALYSIS, ROUTINE W REFLEX MICROSCOPIC
Bilirubin Urine: NEGATIVE
Glucose, UA: NEGATIVE mg/dL
Ketones, ur: NEGATIVE mg/dL
Leukocytes,Ua: NEGATIVE
Nitrite: NEGATIVE
Protein, ur: NEGATIVE mg/dL
Specific Gravity, Urine: 1.006 (ref 1.005–1.030)
pH: 6 (ref 5.0–8.0)

## 2019-09-21 LAB — CBC
HCT: 51.6 % (ref 39.0–52.0)
Hemoglobin: 17.1 g/dL — ABNORMAL HIGH (ref 13.0–17.0)
MCH: 28.9 pg (ref 26.0–34.0)
MCHC: 33.1 g/dL (ref 30.0–36.0)
MCV: 87.2 fL (ref 80.0–100.0)
Platelets: 317 10*3/uL (ref 150–400)
RBC: 5.92 MIL/uL — ABNORMAL HIGH (ref 4.22–5.81)
RDW: 12.8 % (ref 11.5–15.5)
WBC: 10.4 10*3/uL (ref 4.0–10.5)
nRBC: 0 % (ref 0.0–0.2)

## 2019-09-21 LAB — BASIC METABOLIC PANEL
Anion gap: 12 (ref 5–15)
BUN: 11 mg/dL (ref 6–20)
CO2: 25 mmol/L (ref 22–32)
Calcium: 9.7 mg/dL (ref 8.9–10.3)
Chloride: 103 mmol/L (ref 98–111)
Creatinine, Ser: 1.11 mg/dL (ref 0.61–1.24)
GFR calc Af Amer: >60 mL/min (ref >60)
GFR calc non Af Amer: >60 mL/min (ref >60)
Glucose, Bld: 107 mg/dL — ABNORMAL HIGH (ref 70–99)
Potassium: 4.4 mmol/L (ref 3.5–5.1)
Sodium: 140 mmol/L (ref 135–145)

## 2019-09-21 LAB — CBG MONITORING, ED: Glucose-Capillary: 78 mg/dL (ref 70–99)

## 2019-09-21 MED ORDER — DEXAMETHASONE SODIUM PHOSPHATE 10 MG/ML IJ SOLN
10.0000 mg | Freq: Once | INTRAMUSCULAR | Status: AC
  Administered 2019-09-21: 10 mg via INTRAVENOUS
  Filled 2019-09-21: qty 1

## 2019-09-21 MED ORDER — SODIUM CHLORIDE 0.9% FLUSH: 3.0000 mL | Freq: Once | INTRAVENOUS | Status: DC

## 2019-09-21 MED ORDER — LISINOPRIL-HYDROCHLOROTHIAZIDE 10-12.5 MG PO TABS: 1.0000 | ORAL_TABLET | Freq: Every day | ORAL | 0 refills | Status: DC

## 2019-09-21 MED ORDER — DIPHENHYDRAMINE HCL 50 MG/ML IJ SOLN
50.0000 mg | Freq: Once | INTRAMUSCULAR | Status: AC
  Administered 2019-09-21: 50 mg via INTRAVENOUS
  Filled 2019-09-21: qty 1

## 2019-09-21 MED ORDER — PROCHLORPERAZINE EDISYLATE 10 MG/2ML IJ SOLN
10.0000 mg | Freq: Once | INTRAMUSCULAR | Status: AC
  Administered 2019-09-21: 10 mg via INTRAVENOUS
  Filled 2019-09-21: qty 2

## 2019-09-21 MED ORDER — SODIUM CHLORIDE 0.9 % IV BOLUS: 1000.0000 mL | Freq: Once | INTRAVENOUS | Status: DC

## 2019-09-21 MED ORDER — LISINOPRIL 10 MG PO TABS
10.0000 mg | ORAL_TABLET | Freq: Every day | ORAL | Status: DC
  Administered 2019-09-21: 10 mg via ORAL
  Filled 2019-09-21: qty 1

## 2019-09-21 MED ORDER — HYDROCHLOROTHIAZIDE 25 MG PO TABS: 12.5000 mg | ORAL_TABLET | Freq: Once | ORAL | Status: DC

## 2019-09-21 NOTE — ED Provider Notes (Signed)
Patient was handed off from [employer] due to shift change.  She provided HPI and work-up, please see her note for full HPI.    Patient presents to emergency department with chief complaint of headache that worsens when he bends over, complains of visual changes, has no headache history or hypertension.  Patient denies hitting his head, falls, on anticoagulants or drug use.  upon arrival he had elevated blood pressure and a continuing headache.  He was worked up for intercranial mass, head bleed and given a migraine cocktail. Physical Exam  BP (!) 158/90 (BP Location: Left Arm)   Pulse (!) 52   Temp 98.3 F (36.8 C) (Oral)   Resp 14   Ht 6\' 2"  (1.88 m)   Wt 114.8 kg   SpO2 100%   BMI 32.48 kg/m   Physical Exam Vitals and nursing note reviewed.  Constitutional:      General: He is not in acute distress.    Appearance: Normal appearance. He is not ill-appearing or diaphoretic.  HENT:     Head: Normocephalic and atraumatic.     Nose: No congestion or rhinorrhea.     Mouth/Throat:     Mouth: Mucous membranes are moist.     Pharynx: Oropharynx is clear.  Eyes:     General: No visual field deficit or scleral icterus.    Extraocular Movements: Extraocular movements intact.     Conjunctiva/sclera: Conjunctivae normal.     Pupils: Pupils are equal, round, and reactive to light.  Cardiovascular:     Rate and Rhythm: Normal rate and regular rhythm.     Pulses: Normal pulses.     Heart sounds: No murmur heard.  No friction rub. No gallop.   Pulmonary:     Effort: Pulmonary effort is normal. No respiratory distress.     Breath sounds: No wheezing, rhonchi or rales.  Abdominal:     General: There is no distension.     Palpations: Abdomen is soft.     Tenderness: There is no abdominal tenderness. There is no guarding.  Musculoskeletal:        General: No swelling or tenderness.     Cervical back: No rigidity or tenderness.  Skin:    General: Skin is warm and dry.      Capillary Refill: Capillary refill takes less than 2 seconds.     Findings: No rash.  Neurological:     General: No focal deficit present.     Mental Status: He is alert and oriented to person, place, and time.     GCS: GCS eye subscore is 4. GCS verbal subscore is 5. GCS motor subscore is 6.     Cranial Nerves: Cranial nerves are intact. No cranial nerve deficit or facial asymmetry.     Sensory: Sensation is intact. No sensory deficit.     Motor: Motor function is intact. No weakness or pronator drift.     Coordination: Coordination is intact. Romberg sign negative. Finger-Nose-Finger Test and Heel to [facility] Test normal.  Psychiatric:        Mood and Affect: Mood normal.     ED Course/Procedures     Procedures  MDM  I have personally reviewed all imaging, labs and have interpreted them.  Due to patient presentation most concern for intracranial bleed versus intracranial mass versus hypertensive urgency.  Unlikely patient suffering from hypertensive crisis as there is no signs of organ damage.  BMP does not show electrolyte abnormalities, AKI, physical exam was benign  for abdominal tenderness or acute abdomen.  CBC does not show leukocytosis or anemia.  Glucose was 78.  UA showed moderate hemoglobin and 11-20 red blood cells unlikely patient suffering from UTI or acute kidney injury as patient does not have increasing creatinine or bun, no CVA tenderness.  Will recommend patient drink plenty of fluids.  Unlikely patient suffering from CVA as neuro exam was benign no deficits noted.  CT scan came back unremarkable no signs of intercranial mass or intracranial head bleed.  Patient was reassessed and admits that his headache has improved. As well as his blood pressure since being given lisinopril    Patient appears resting calmly in bed, show no acute signs stress.  Patient's vitals have improved and does not meet criteria to be admitted to the hospital.  Likely patient suffered from a headache  secondary to hypertension.  Differential diagnosis includes migraines, hypertension, hypothyroidism.  Recommend patient follow-up with cardiologist and primary care provider for further evaluation management.  Patient will be sent home with hypertension medications.  Patient was discussed with attending who agrees with assessment and plan.  Patient was given at home care as well as strict return precautions.  Patient verbalized that he understood and agrees the plan.       Carroll Sage, PA-C 09/21/19 1737    Melene Plan, DO 09/21/19 1744

## 2019-09-21 NOTE — ED Triage Notes (Signed)
Pt reports headache and dizziness that began 2 days ago. States that when he sneezes, he has severe head pain. Pt a/ox4, speech clear, moves all limbs equally, nad.

## 2019-09-21 NOTE — Discharge Instructions (Addendum)
  A new medication Zestoretic was sent to your pharmacy.  This is for high blood pressure.  You should take 1 pill daily.  Check your blood pressure daily, first thing in the morning and keep a written log to bring to your primary care doctor when you follow up.   A referral has been sent to the cardiology clinic here in [place].  They should be contacting you within 1 week to schedule follow-up appointment.  If you do not hear from them you can call to schedule an appointment.

## 2019-09-21 NOTE — ED Provider Notes (Signed)
[facility] EMERGENCY DEPARTMENT Provider Note   CSN: [id number] Arrival date & time: 09/21/19  9381     History Chief Complaint  Patient presents with   Dizziness   Headache    Christian Taylor is a 49 y.o. male past medical history significant for gout, unstable angina.  HPI Patient presents to emergency room today with chief complaint of intermitent headache and dizziness x2 days. Patient states he feels like he has a throbbing sensation on the left side of his head. He states when he changes position he feels like the room is spinning. He states when he coughs he has severe head pain. He denies double vision however states he feels like his vision is blurry and taking longer to focus and it usually does. He has been taking tylenol for his headache without any symptom relief. He rates headache 8/10 in severity. He does state headache has progressively worsened since onset. He has had a headache once or twice before, however is unable to tell if this is similar as headache was so long ago and happened infrequently. Denies fever, syncope, head trauma, photophobia, phonophobia, UL throbbing, N/V, visual changes, stiff neck, neck pain, rash, or "thunderclap" onset. Patient does not have known diagnosis of hypertension however has not seen PCP in 2 to 3 years.     Past Medical History:  Diagnosis Date   Gout     Patient Active Problem List   Diagnosis Date Noted   Unstable angina pectoris [facility])     History reviewed. No pertinent surgical history.     No family history on file.  Social History   Tobacco Use   Smoking status: Current Every Day Smoker    Packs/day: 0.50    Years: 17.00    Pack years: 8.50    Types: Cigarettes   Smokeless tobacco: Never Used  Building services engineer Use: Never used  Substance Use Topics   Alcohol use: Yes   Drug use: Yes    Types: Marijuana    Comment: occasionally    Home Medications Prior to Admission  medications   Medication Sig Start Date End Date Taking? Authorizing Provider  acetaminophen (TYLENOL) 500 MG tablet Take 1,000-2,000 mg by mouth daily as needed for mild pain.   Yes [provider]  allopurinol (ZYLOPRIM) 300 MG tablet Take 600 mg by mouth daily.   Yes [provider]  colchicine 0.6 MG tablet Take 0.6 mg by mouth daily as needed (gout).   Yes [provider]  lisinopril-hydrochlorothiazide (ZESTORETIC) 10-12.5 MG tablet Take 1 tablet by mouth daily. 09/21/19 10/21/19  Halli Equihua, Caroleen Hamman, PA-C    Allergies    Patient has no known allergies.  Review of Systems   Review of Systems  All other systems are reviewed and are negative for acute change except as noted in the HPI.   Physical Exam Updated Vital Signs BP (!) 178/107 (BP Location: Right Arm)    Pulse (!) 59    Temp 98.9 F (37.2 C) (Oral)    Resp 20    Ht 6\' 2"  (1.88 m)    Wt 114.8 kg    SpO2 100%    BMI 32.48 kg/m   Physical Exam Vitals and nursing note reviewed.  Constitutional:      General: He is not in acute distress.    Appearance: He is not ill-appearing.  HENT:     Head: Normocephalic and atraumatic.     Comments: No sinus or  temporal tenderness.    Right Ear: Tympanic membrane and external ear normal.     Left Ear: Tympanic membrane and external ear normal.     Nose: Nose normal.     Mouth/Throat:     Mouth: Mucous membranes are moist.     Pharynx: Oropharynx is clear.  Eyes:     General: No scleral icterus.       Right eye: No discharge.        Left eye: No discharge.     Extraocular Movements: Extraocular movements intact.     Conjunctiva/sclera: Conjunctivae normal.     Pupils: Pupils are equal, round, and reactive to light.  Neck:     Vascular: No JVD.  Cardiovascular:     Rate and Rhythm: Normal rate and regular rhythm.     Pulses: Normal pulses.          Radial pulses are 2+ on the right side and 2+ on the left side.     Heart sounds: Normal heart sounds.    Pulmonary:     Comments: Lungs clear to auscultation in all fields. Symmetric chest rise. No wheezing, rales, or rhonchi. Abdominal:     Comments: Abdomen is soft, non-distended, and non-tender in all quadrants. No rigidity, no guarding. No peritoneal signs.  Musculoskeletal:        General: Normal range of motion.     Cervical back: Normal range of motion.  Skin:    General: Skin is warm and dry.     Capillary Refill: Capillary refill takes less than 2 seconds.  Neurological:     Mental Status: He is oriented to person, place, and time.     GCS: GCS eye subscore is 4. GCS verbal subscore is 5. GCS motor subscore is 6.     Comments: Mental Status:  Alert, oriented, thought content appropriate, able to give a coherent history. Speech fluent without evidence of aphasia. Able to follow 2 step commands without difficulty.  Cranial Nerves:  II:  Peripheral visual fields grossly normal, pupils equal, round, reactive to light III,IV, VI: ptosis not present, extra-ocular motions intact bilaterally  V,VII: smile symmetric, facial light touch sensation equal VIII: hearing grossly normal to voice  X: uvula elevates symmetrically  XI: bilateral shoulder shrug symmetric and strong XII: midline tongue extension without fassiculations Motor:  Normal tone. 5/5 in upper and lower extremities bilaterally including strong and equal grip strength and dorsiflexion/plantar flexion Sensory: Pinprick and light touch normal in all extremities.  Deep Tendon Reflexes: 2+ and symmetric in the biceps and patella Cerebellar: normal finger-to-nose with bilateral upper extremities Gait: normal gait and balance CV: distal pulses palpable throughout   Psychiatric:        Behavior: Behavior normal.     ED Results / Procedures / Treatments   Labs (all labs ordered are listed, but only abnormal results are displayed) Labs Reviewed  BASIC METABOLIC PANEL - Abnormal; Notable for the following components:       Result Value   Glucose, Bld 107 (*)    All other components within normal limits  CBC - Abnormal; Notable for the following components:   RBC 5.92 (*)    Hemoglobin 17.1 (*)    All other components within normal limits  URINALYSIS, ROUTINE W REFLEX MICROSCOPIC  CBG MONITORING, ED    EKG EKG Interpretation  Date/Time:  Tuesday September 21 2019 08:44:43 EDT Ventricular Rate:  57 PR Interval:  132 QRS Duration: 94 QT Interval:  418 QTC  Calculation: 406 R Axis:   11 Text Interpretation: Sinus bradycardia Left ventricular hypertrophy with repolarization abnormality ( R in aVL ) Cannot rule out Septal infarct , age undetermined Abnormal ECG similar to EKG from 2013 Confirmed by Eber Hong ([id number]) on 09/21/2019 1:16:22 PM   Radiology No results found.  Procedures Procedures (including critical care time)  Medications Ordered in ED Medications  sodium chloride flush (NS) 0.9 % injection 3 mL (has no administration in time range)  sodium chloride 0.9 % bolus 1,000 mL (has no administration in time range)  lisinopril (ZESTRIL) tablet 10 mg (has no administration in time range)  diphenhydrAMINE (BENADRYL) injection 50 mg (50 mg Intravenous Given 09/21/19 1514)  prochlorperazine (COMPAZINE) injection 10 mg (10 mg Intravenous Given 09/21/19 1514)  dexamethasone (DECADRON) injection 10 mg (10 mg Intravenous Given 09/21/19 1514)    ED Course  I have reviewed the triage vital signs and the nursing notes.  Pertinent labs & imaging results that were available during my care of the patient were reviewed by me and considered in my medical decision making (see chart for details).  Vitals:   09/21/19 1134 09/21/19 1523  BP: (!) 178/107 (!) 158/90  Pulse: (!) 59 (!) 52  Resp: 20 14  Temp:  98.3 F (36.8 C)  SpO2: 100% 100%      MDM Rules/Calculators/A&P                          History provided by patient with additional history obtained from chart review.    49 year old male  presenting with headache, dizziness elevated blood pressure.  BP in triage 173/106, he is afebrile, no tachycardia or hypoxia.  Neuro exam is normal. He has no focal weakness. EKG is abnormal however similar to prior, no STEMI.  Labs collected in triage.  There are no signs of endorgan damage.  No leukocytosis, no anemia, no severe electrolyte derangement, no renal insufficiency.  UA not yet collected.  Cocktail ordered.  Given patient symptoms head CT ordered. The patient was discussed with and seen by Dr. Hyacinth Meeker who agrees with the treatment plan.  Patient care transferred to W. Faulkner PA-C at the end of my shift pending head CT, UA, and reassessment. Patient presentation, ED course, and plan of care discussed with review of all pertinent labs and imaging. Please see his note for further details regarding further ED course and disposition.   If patient is discharged, he will need close follow up with pcp to have blood pressure rechecked. Will start him on combo lisinopril-hctz. Also [facility] amb[facility] for [facility].   Portions of this note were generated with Scientist, clinical (histocompatibility and immunogenetics). Dictation errors may occur despite best attempts at proofreading.   Final Clinical Impression(s) / ED Diagnoses Final diagnoses:  Acute nonintractable headache, unspecified headache type  Elevated blood pressure reading    Rx / DC Orders ED Discharge Orders         Ordered    lisinopril-hydrochlorothiazide (ZESTORETIC) 10-12.5 MG tablet  Daily     Discontinue  Reprint     09/21/19 1603    Ambulatory referral to Cardiology     Discontinue  Reprint    Comments: Uncontrolled htn   09/21/19 1604           Emalee Knies, Caroleen Hamman, PA-C 09/21/19 1616    Eber Hong, MD 09/23/19 [phone]

## 2019-09-24 ENCOUNTER — Other Ambulatory Visit: Payer: Self-pay

## 2019-09-24 ENCOUNTER — Emergency Department (HOSPITAL_COMMUNITY)
Admission: EM | Admit: 2019-09-24 | Discharge: 2019-09-24 | Disposition: A | Discharge status: Home / Self Care | Payer: BC Managed Care – PPO | Attending: Emergency Medicine | Admitting: Emergency Medicine

## 2019-09-24 ENCOUNTER — Emergency Department (HOSPITAL_COMMUNITY): Payer: BC Managed Care – PPO

## 2019-09-24 DIAGNOSIS — J019 Acute sinusitis, unspecified: Secondary | ICD-10-CM | POA: Insufficient documentation

## 2019-09-24 DIAGNOSIS — Z79899 Other long term (current) drug therapy: Secondary | ICD-10-CM | POA: Insufficient documentation

## 2019-09-24 DIAGNOSIS — G44209 Tension-type headache, unspecified, not intractable: Secondary | ICD-10-CM | POA: Diagnosis not present

## 2019-09-24 DIAGNOSIS — R5383 Other fatigue: Secondary | ICD-10-CM | POA: Diagnosis present

## 2019-09-24 DIAGNOSIS — F17210 Nicotine dependence, cigarettes, uncomplicated: Secondary | ICD-10-CM | POA: Diagnosis not present

## 2019-09-24 DIAGNOSIS — J3489 Other specified disorders of nose and nasal sinuses: Secondary | ICD-10-CM

## 2019-09-24 DIAGNOSIS — I10 Essential (primary) hypertension: Secondary | ICD-10-CM | POA: Diagnosis not present

## 2019-09-24 LAB — BASIC METABOLIC PANEL
Anion gap: 12 (ref 5–15)
BUN: 14 mg/dL (ref 6–20)
CO2: 21 mmol/L — ABNORMAL LOW (ref 22–32)
Calcium: 9.3 mg/dL (ref 8.9–10.3)
Chloride: 105 mmol/L (ref 98–111)
Creatinine, Ser: 0.92 mg/dL (ref 0.61–1.24)
GFR calc Af Amer: >60 mL/min (ref >60)
GFR calc non Af Amer: >60 mL/min (ref >60)
Glucose, Bld: 88 mg/dL (ref 70–99)
Potassium: 3.6 mmol/L (ref 3.5–5.1)
Sodium: 138 mmol/L (ref 135–145)

## 2019-09-24 LAB — CBC
HCT: 46.6 % (ref 39.0–52.0)
Hemoglobin: 15.8 g/dL (ref 13.0–17.0)
MCH: 29.4 pg (ref 26.0–34.0)
MCHC: 33.9 g/dL (ref 30.0–36.0)
MCV: 86.6 fL (ref 80.0–100.0)
Platelets: 313 10*3/uL (ref 150–400)
RBC: 5.38 MIL/uL (ref 4.22–5.81)
RDW: 13.1 % (ref 11.5–15.5)
WBC: 16.3 10*3/uL — ABNORMAL HIGH (ref 4.0–10.5)
nRBC: 0 % (ref 0.0–0.2)

## 2019-09-24 LAB — TROPONIN I (HIGH SENSITIVITY): Troponin I (High Sensitivity): 10 ng/L (ref <18)

## 2019-09-24 MED ORDER — ACETAMINOPHEN 500 MG PO TABS
1000.0000 mg | ORAL_TABLET | Freq: Once | ORAL | Status: AC
  Administered 2019-09-24: 1000 mg via ORAL
  Filled 2019-09-24: qty 2

## 2019-09-24 MED ORDER — AMOXICILLIN-POT CLAVULANATE 500-125 MG PO TABS
1.0000 | ORAL_TABLET | Freq: Three times a day (TID) | ORAL | 0 refills | Status: DC
Start: 2019-09-24 — End: 2019-09-24

## 2019-09-24 MED ORDER — AMOXICILLIN-POT CLAVULANATE 500-125 MG PO TABS: 1.0000 | ORAL_TABLET | Freq: Three times a day (TID) | ORAL | 0 refills | Status: AC

## 2019-09-24 NOTE — ED Triage Notes (Addendum)
Pt to ED via EMS from [facility] C/O  High blood pressure and near syncopal episode. Reports to ED 3 days ago for the same symptoms. Pt denies chest pain. C/O sinus pain, head ache, dizziness, Pt recently started on bp medication ,  Last VS hr 78, bp 160/92, 97%RA, #18 LAC. No medication given by EMS, CBG 116. Pt a&o x 4.

## 2019-09-24 NOTE — ED Provider Notes (Signed)
[facility] EMERGENCY DEPARTMENT Provider Note   CSN: [id number] Arrival date & time: 09/24/19  1836     History Chief Complaint  Patient presents with  . Hypertension  . Headache  . Near Syncope    Jermarcus Mcfadyen is a 49 y.o. male hypertension and fatigue. Pt states he previously presented to ED earlier this week (3 days prior) with 10/10 HA and was started on new htn medication. Pt endorses improvement in headache since that time. Endorses pain above nasal sinuses, b/l temples 1-2/10. Pt states earlier today at work he was "feeling off" and had trouble getting to his house because he was so tired.  Pt presented to UC where SBP noted 190s. Pt endorses increased stressors at work that seem to worsen his headache. Denies changes in vision. Endorses cough, nausea, new SOB at rest that was not present earlier in the week. Pt denies CP but states he has history of unstable angina. Denies fever. Has received both doses of COVID vaccine.       Past Medical History:  Diagnosis Date  . Gout     Patient Active Problem List   Diagnosis Date Noted  . Unstable angina pectoris (HCC)     No past surgical history on file.     No family history on file.  Social History   Tobacco Use  . Smoking status: Current Every Day Smoker    Packs/day: 0.50    Years: 17.00    Pack years: 8.50    Types: Cigarettes  . Smokeless tobacco: Never Used  Vaping Use  . Vaping Use: Never used  Substance Use Topics  . Alcohol use: Yes  . Drug use: Yes    Types: Marijuana    Comment: occasionally    Home Medications Prior to Admission medications   Medication Sig Start Date End Date Taking? Authorizing Provider  acetaminophen (TYLENOL) 500 MG tablet Take 1,000-2,000 mg by mouth daily as needed for mild pain.    [provider]  allopurinol (ZYLOPRIM) 300 MG tablet Take 600 mg by mouth daily.    [provider]  amoxicillin-clavulanate (AUGMENTIN) 500-125 MG  tablet Take 1 tablet (500 mg total) by mouth 3 (three) times daily for 10 days. 09/24/19 10/04/19  Alvira Monday, MD  colchicine 0.6 MG tablet Take 0.6 mg by mouth daily as needed (gout).    [provider]  lisinopril-hydrochlorothiazide (ZESTORETIC) 10-12.5 MG tablet Take 1 tablet by mouth daily. 09/21/19 10/21/19  Albrizze, Caroleen Hamman, PA-C    Allergies    Patient has no known allergies.  Review of Systems   Review of Systems  Constitutional: Positive for fatigue. Negative for chills and fever.  HENT: Positive for congestion, sinus pressure and sinus pain. Negative for rhinorrhea and sore throat.   Eyes: Negative for photophobia and visual disturbance.  Respiratory: Positive for cough and shortness of breath.   Cardiovascular: Negative for chest pain and leg swelling.  Gastrointestinal: Positive for nausea. Negative for abdominal pain, blood in stool and vomiting.  Genitourinary: Negative for dysuria and hematuria.  Musculoskeletal: Negative for back pain.  Skin: Negative for rash and wound.  Neurological: Positive for headaches. Negative for dizziness and syncope.  Psychiatric/Behavioral: Negative.  Negative for confusion.    Physical Exam Updated Vital Signs BP 130/84 (BP Location: Right Arm)   Pulse 61   Temp 98.7 F (37.1 C) (Oral)   Resp (!) 22   Ht 6\' 2"  (1.88 m)   Wt 114.3 kg  SpO2 96%   BMI 32.35 kg/m   Physical Exam Vitals and nursing note reviewed.  Constitutional:      General: He is not in acute distress.    Appearance: He is well-developed and normal weight. He is not ill-appearing, toxic-appearing or diaphoretic.  HENT:     Head: Normocephalic and atraumatic.  Eyes:     Extraocular Movements: Extraocular movements intact.  Cardiovascular:     Rate and Rhythm: Normal rate and regular rhythm.     Heart sounds: No murmur heard.   Pulmonary:     Effort: Pulmonary effort is normal. No respiratory distress.     Breath sounds: Normal breath sounds.  No wheezing.     Comments: Pt coughing during exam.   Abdominal:     Palpations: Abdomen is soft.     Tenderness: There is no abdominal tenderness.  Skin:    General: Skin is warm.     Findings: No rash.  Neurological:     Mental Status: He is alert and oriented to person, place, and time. Mental status is at baseline.     GCS: GCS eye subscore is 4. GCS verbal subscore is 5. GCS motor subscore is 6.     Cranial Nerves: No cranial nerve deficit, dysarthria or facial asymmetry.     Sensory: No sensory deficit.     Coordination: Coordination normal.  Psychiatric:        Mood and Affect: Mood normal.        Behavior: Behavior normal.     Comments: Pt endorses stress at work that worsens headache.      ED Results / Procedures / Treatments   Labs (all labs ordered are listed, but only abnormal results are displayed) Labs Reviewed  CBC - Abnormal; Notable for the following components:      Result Value   WBC 16.3 (*)    All other components within normal limits  BASIC METABOLIC PANEL - Abnormal; Notable for the following components:   CO2 21 (*)    All other components within normal limits  TROPONIN I (HIGH SENSITIVITY)  TROPONIN I (HIGH SENSITIVITY)    EKG None  Radiology DG Chest Portable 1 View  Result Date: 09/24/2019 CLINICAL DATA:  49 year old male with fatigue. EXAM: PORTABLE CHEST 1 VIEW COMPARISON:  Chest radiograph dated 05/12/2017. FINDINGS: The heart size and mediastinal contours are within normal limits. Both lungs are clear. The visualized skeletal structures are unremarkable. IMPRESSION: No active disease. Electronically Signed   By: Elgie Collard M.D.   On: 09/24/2019 19:48    Procedures Procedures (including critical care time)  Medications Ordered in ED Medications  acetaminophen (TYLENOL) tablet 1,000 mg (1,000 mg Oral Given 09/24/19 1929)    ED Course  I have reviewed the triage vital signs and the nursing notes.  Pertinent labs & imaging  results that were available during my care of the patient were reviewed by me and considered in my medical decision making (see chart for details).    MDM Rules/Calculators/A&P                           Pt is an otherwise healthy 49 yo M who presents for hypertension, presyncope.  Patient presented earlier this week with concern for headache that is improved today.  Patient does endorse increased stress at work that seems to worsen his headaches.  Patient does also note sinus pain, frontal headache and is concerned he may have  sinus infection.  Denies fever/chills. Of note following last ED evaluation patient was discharged home on new hypertensive medication.  Patient states he began to feel ill at work today, was tired when driving home felt like he was about to pass out.  Patient presented to UC found to be hypertensive with SBP 190s.  On presentation here patient afebrile, HDS, NAD.  Patient with SBP 130-140s.  On exam patient with no cardiac murmur appreciated.  Lungs clear to auscultation in all fields.  Abdomen soft, nontender.  No lower extremity pitting edema.  Cranial nerves II-XII grossly intact.  Intact strength/sensation in all extremities.  Patient overall well-appearing.  Patient chart reviewed from last presentation including labs, imaging.  CT head was completed 3 days prior during last presentation and was not significant for acute intracranial abnormality, however CT does show moderate paranasal sinus disease, with near complete opacification of right sphenoid sinus.  These findings in the setting of patient symptoms, new leukocytosis (WBC 16.3 from 10.44 days ago) are consistent with sinus infection will discharge on course of Augmentin.  No metabolic abnormalities on BMP that could be contributing to symptoms.  Due to concern for presyncope, will rule out cardiac cause with troponin, CXR.  EKG reviewed and significant for sinus rhythm, LVH however when compared with prior no significant  changes noted.  Initial troponin reassuring (10) and as patient does not have chest pain at this time do not believe delta Theodis Aguas is needed.  Discussed findings with patient/wife at bedside.  Rx provided for Augmentin.  Patient to follow-up as outpatient as needed for persistent symptoms.  Patient discharged in stable condition.  Final Clinical Impression(s) / ED Diagnoses Final diagnoses:  Tension-type headache, not intractable, unspecified chronicity pattern  Sinus pressure  Fatigue, unspecified type  Acute sinusitis, recurrence not specified, unspecified location    Rx / DC Orders ED Discharge Orders         Ordered    amoxicillin-clavulanate (AUGMENTIN) 500-125 MG tablet  3 times daily,   Status:  Discontinued     Reprint     09/24/19 2044    amoxicillin-clavulanate (AUGMENTIN) 500-125 MG tablet  3 times daily     Discontinue  Reprint     09/24/19 2101           Golden Pop, MD 09/25/19 1345    Alvira Monday, MD 09/25/19 1542

## 2019-09-24 NOTE — ED Notes (Signed)
Discharge instructions discussed with  Pt. Pt verbalized understanding with no questions at this time. Pt to go home with significant other at bedside

## 2019-10-08 ENCOUNTER — Other Ambulatory Visit: Payer: Self-pay | Admitting: Family Medicine

## 2019-10-08 DIAGNOSIS — I10 Essential (primary) hypertension: Secondary | ICD-10-CM

## 2019-10-08 DIAGNOSIS — R3129 Other microscopic hematuria: Secondary | ICD-10-CM

## 2019-10-14 ENCOUNTER — Ambulatory Visit
Admission: RE | Admit: 2019-10-14 | Discharge: 2019-10-14 | Disposition: A | Discharge status: Home / Self Care | Payer: BC Managed Care – PPO | Source: Ambulatory Visit | Attending: Family Medicine | Admitting: Family Medicine

## 2019-10-14 DIAGNOSIS — R3129 Other microscopic hematuria: Secondary | ICD-10-CM

## 2019-10-14 DIAGNOSIS — I10 Essential (primary) hypertension: Secondary | ICD-10-CM

## 2019-11-10 ENCOUNTER — Ambulatory Visit (INDEPENDENT_AMBULATORY_CARE_PROVIDER_SITE_OTHER): Payer: BC Managed Care – PPO | Admitting: Cardiology

## 2019-11-10 ENCOUNTER — Encounter: Payer: Self-pay | Admitting: Cardiology

## 2019-11-10 ENCOUNTER — Other Ambulatory Visit: Payer: Self-pay

## 2019-11-10 VITALS — BP 130/90 | HR 67 | Ht 74.0 in | Wt 254.4 lb

## 2019-11-10 DIAGNOSIS — F419 Anxiety disorder, unspecified: Secondary | ICD-10-CM | POA: Diagnosis not present

## 2019-11-10 DIAGNOSIS — I10 Essential (primary) hypertension: Secondary | ICD-10-CM | POA: Diagnosis not present

## 2019-11-10 DIAGNOSIS — R9431 Abnormal electrocardiogram [ECG] [EKG]: Secondary | ICD-10-CM | POA: Diagnosis not present

## 2019-11-10 NOTE — Progress Notes (Signed)
Cardiology Office Note:    Date:  11/10/2019   ID:  Christian Taylor, DOB September 07, 1970, MRN [id number]  PCP:  [name], MD  [facility] Cardiologist:  No primary care provider on file.  [facility] Electrophysiologist:  None   Referring MD: [name], MD    History of Present Illness:    Christian Taylor is a 49 y.o. male here for the evaluation of hypertension at the request of Dr.[name].  He was in the emergency department on 09/24/2019 with hypertension and fatigue.  Had a 10/10 headache.  Was started on new hypertension medication.  He felt off.  At urgent care his blood pressure was 190.  Stressors at work.  He had some cough nausea shortness of breath.  No chest pain.  Has been vaccinated for Covid.  He stated he had a history of unstable angina.  Smoker. On lisinopril hydrochlorothiazide  Blood pressure eventually went down to the 130s.  Head CT was negative.  Some moderate sinus disease  EKG did show sinus rhythm and LVH repolarization abnormalities noted with some T wave inversion personally reviewed and interpreted. .  Initial troponin was 10.  Negative.  Over 10 years ago NUC stress - ok.  Prior vertigo. Anxious at times. Painc Used to be on Paxil.   Past Medical History:  Diagnosis Date  . Gout     No past surgical history on file.  Current Medications: Current Meds  Medication Sig  . acetaminophen (TYLENOL) 500 MG tablet Take 1,000-2,000 mg by mouth daily as needed for mild pain.  Marland Kitchen allopurinol (ZYLOPRIM) 300 MG tablet Take 600 mg by mouth daily.  . colchicine 0.6 MG tablet Take 0.6 mg by mouth daily as needed (gout).  . hydrochlorothiazide (HYDRODIURIL) 12.5 MG tablet Take 12.5 mg by mouth 2 (two) times daily.  Marland Kitchen losartan (COZAAR) 25 MG tablet Take 25 mg by mouth 2 (two) times daily.     Allergies:   Patient has no known allergies.   Social History   Socioeconomic History  . Marital status: Married    Spouse name: Not on file  .  Number of children: Not on file  . Years of education: Not on file  . Highest education level: Not on file  Occupational History  . Not on file  Tobacco Use  . Smoking status: Current Every Day Smoker    Packs/day: 0.50    Years: 17.00    Pack years: 8.50    Types: Cigarettes  . Smokeless tobacco: Never Used  Vaping Use  . Vaping Use: Never used  Substance and Sexual Activity  . Alcohol use: Yes  . Drug use: Yes    Types: Marijuana    Comment: occasionally  . Sexual activity: Not on file  Other Topics Concern  . Not on file  Social History Narrative  . Not on file   Social Determinants of Health   Financial Resource Strain:   . Difficulty of Paying Living Expenses: Not on file  Food Insecurity:   . Worried About Programme researcher, broadcasting/film/video in the Last Year: Not on file  . Ran Out of Food in the Last Year: Not on file  Transportation Needs:   . Lack of Transportation (Medical): Not on file  . Lack of Transportation (Non-Medical): Not on file  Physical Activity:   . Days of Exercise per Week: Not on file  . Minutes of Exercise per Session: Not on file  Stress:   . Feeling of Stress :  Not on file  Social Connections:   . Frequency of Communication with Friends and Family: Not on file  . Frequency of Social Gatherings with Friends and Family: Not on file  . Attends Religious Services: Not on file  . Active Member of Clubs or Organizations: Not on file  . Attends Banker Meetings: Not on file  . Marital Status: Not on file     Family History: No early family history of coronary artery disease  ROS:   Please see the history of present illness.     All other systems reviewed and are negative.  EKGs/Labs/Other Studies Reviewed:    The following studies were reviewed today: Head CT, ER notes, EKG all personally reviewed.  EKG: As above  Recent Labs: 09/24/2019: BUN 14; Creatinine, Ser 0.92; Hemoglobin 15.8; Platelets 313; Potassium 3.6; Sodium 138  Recent  Lipid Panel    Component Value Date/Time   CHOL 150 05/12/2017 0731   TRIG 104 05/12/2017 0731   HDL 48 05/12/2017 0731   CHOLHDL 3.1 05/12/2017 0731   VLDL 21 05/12/2017 0731   LDLCALC 81 05/12/2017 0731    Physical Exam:    VS:  BP 130/90   Pulse 67   Ht 6\' 2"  (1.88 m)   Wt 254 lb 6.4 oz (115.4 kg)   SpO2 96%   BMI 32.66 kg/m     Wt Readings from Last 3 Encounters:  11/10/19 254 lb 6.4 oz (115.4 kg)  09/24/19 252 lb (114.3 kg)  09/21/19 253 lb (114.8 kg)     GEN:  Well nourished, well developed in no acute distress HEENT: Normal NECK: No JVD; No carotid bruits LYMPHATICS: No lymphadenopathy CARDIAC: RRR, no murmurs, rubs, gallops RESPIRATORY:  Clear to auscultation without rales, wheezing or rhonchi  ABDOMEN: Soft, non-tender, non-distended MUSCULOSKELETAL:  No edema; No deformity  SKIN: Warm and dry NEUROLOGIC:  Alert and oriented x 3 PSYCHIATRIC:  Normal affect   ASSESSMENT:    1. Nonspecific abnormal electrocardiogram (ECG) (EKG)   2. Essential hypertension   3. Anxiety    PLAN:    In order of problems listed above:  Left ventricular hypertrophy on ECG/abnormal EKG, repolarization abnormalities -I will check an echocardiogram to ensure proper structure and function of his heart.  Measure wall thickness.  Certainly possible that with underlying hypertension he may have left ventricular hypertrophy.  Want to make sure that he does not have any degrees of hypertrophic cardiomyopathy.  Essential hypertension -Much better today 130/90.  Agree with current medication strategy including both losartan and hydrochlorothiazide.  Continue to titrate the losartan if necessary, could consider addition of amlodipine as well.  Diet, exercise, salt restriction.  Creatinine 0.92 troponin 10 hemoglobin 15.8 potassium 3.6  Anxiety/panic -Addressed this with me today.  Certainly in the middle of high anxiety, his blood pressure can be extremely elevated.  This is a normal  response to the catecholamines.  He used to be on Paxil/Xanax at times.  I encouraged him to talk to Dr. 09/23/19 about potentially getting back on an SSRI.  Vertigo -He wonders if the symptoms of dizziness could have been from anxiety or hyperventilation.  Certainly this could be the case.  He is going to see an ENT.   Medication Adjustments/Labs and Tests Ordered: Current medicines are reviewed at length with the patient today.  Concerns regarding medicines are outlined above.  Orders Placed This Encounter  Procedures  . ECHOCARDIOGRAM COMPLETE   No orders of the defined types were  placed in this encounter.   Patient Instructions  Medication Instructions:  The current medical regimen is effective;  continue present plan and medications.  *If you need a refill on your cardiac medications before your next appointment, please call your pharmacy*  Testing/Procedures: Your physician has requested that you have an echocardiogram. Echocardiography is a painless test that uses sound waves to create images of your heart. It provides your doctor with information about the size and shape of your heart and how well your heart's chambers and valves are working. This procedure takes approximately one hour. There are no restrictions for this procedure.  Follow-Up: Follow up as needed with Dr Anne Fu.  We recommend signing up for the patient portal called "MyChart".  Sign up information is provided on this After Visit Summary.  MyChart is used to connect with patients for Virtual Visits (Telemedicine).  Patients are able to view lab/test results, encounter notes, upcoming appointments, etc.  Non-urgent messages can be sent to your provider as well.   To learn more about what you can do with MyChart, go to [email or web address].     Thank you for choosing [facility]!!         Signed, Donato Schultz, MD  11/10/2019 10:14 AM    [facility]

## 2019-11-10 NOTE — Patient Instructions (Addendum)
Medication Instructions:  The current medical regimen is effective;  continue present plan and medications.  *If you need a refill on your cardiac medications before your next appointment, please call your pharmacy*  Testing/Procedures: Your physician has requested that you have an echocardiogram. Echocardiography is a painless test that uses sound waves to create images of your heart. It provides your doctor with information about the size and shape of your heart and how well your heart's chambers and valves are working. This procedure takes approximately one hour. There are no restrictions for this procedure.  Follow-Up: Follow up as needed with Dr Anne Fu.  We recommend signing up for the patient portal called "MyChart".  Sign up information is provided on this After Visit Summary.  MyChart is used to connect with patients for Virtual Visits (Telemedicine).  Patients are able to view lab/test results, encounter notes, upcoming appointments, etc.  Non-urgent messages can be sent to your provider as well.   To learn more about what you can do with MyChart, go to [email or web address].     Thank you for choosing [facility]!!

## 2019-11-26 ENCOUNTER — Ambulatory Visit (HOSPITAL_COMMUNITY): Payer: BC Managed Care – PPO | Attending: Cardiology

## 2019-11-26 ENCOUNTER — Other Ambulatory Visit: Payer: Self-pay

## 2019-11-26 DIAGNOSIS — R9431 Abnormal electrocardiogram [ECG] [EKG]: Secondary | ICD-10-CM | POA: Insufficient documentation

## 2019-11-26 LAB — ECHOCARDIOGRAM COMPLETE
Area-P 1/2: 3.77 cm2
S' Lateral: 2.8 cm

## 2019-11-26 MED ORDER — PERFLUTREN LIPID MICROSPHERE
1.0000 mL | INTRAVENOUS | Status: AC | PRN
  Administered 2019-11-26: 2 mL via INTRAVENOUS

## 2020-03-31 ENCOUNTER — Other Ambulatory Visit: Payer: Self-pay | Admitting: Family Medicine

## 2020-03-31 DIAGNOSIS — R519 Headache, unspecified: Secondary | ICD-10-CM

## 2020-03-31 DIAGNOSIS — R42 Dizziness and giddiness: Secondary | ICD-10-CM

## 2020-04-03 ENCOUNTER — Ambulatory Visit
Admission: RE | Admit: 2020-04-03 | Discharge: 2020-04-03 | Disposition: A | Discharge status: Home / Self Care | Payer: BC Managed Care – PPO | Source: Ambulatory Visit | Attending: Family Medicine | Admitting: Family Medicine

## 2020-04-03 ENCOUNTER — Other Ambulatory Visit: Payer: Self-pay

## 2020-04-03 DIAGNOSIS — R42 Dizziness and giddiness: Secondary | ICD-10-CM

## 2020-04-03 DIAGNOSIS — R519 Headache, unspecified: Secondary | ICD-10-CM

## 2020-04-24 ENCOUNTER — Encounter: Payer: Self-pay | Admitting: Neurology

## 2020-04-24 ENCOUNTER — Ambulatory Visit (INDEPENDENT_AMBULATORY_CARE_PROVIDER_SITE_OTHER): Payer: BC Managed Care – PPO | Admitting: Neurology

## 2020-04-24 VITALS — Ht 74.0 in | Wt 261.0 lb

## 2020-04-24 DIAGNOSIS — G4733 Obstructive sleep apnea (adult) (pediatric): Secondary | ICD-10-CM | POA: Diagnosis not present

## 2020-04-24 DIAGNOSIS — R519 Headache, unspecified: Secondary | ICD-10-CM

## 2020-04-24 DIAGNOSIS — R42 Dizziness and giddiness: Secondary | ICD-10-CM | POA: Diagnosis not present

## 2020-04-24 DIAGNOSIS — H538 Other visual disturbances: Secondary | ICD-10-CM | POA: Diagnosis not present

## 2020-04-24 MED ORDER — AMITRIPTYLINE HCL 25 MG PO TABS
50.0000 mg | ORAL_TABLET | Freq: Every day | ORAL | 3 refills | Status: DC
Start: 2020-04-24 — End: 2020-06-05

## 2020-04-24 NOTE — Patient Instructions (Addendum)
It was nice to meet you today.  Your dizziness and headaches may be related to several different issues including elevated blood pressure values at times.  Your sleep apnea may need to be reevaluated and your CPAP machine should be checked for proper settings.  You may be eligible for a new machine, please follow-up with Dr. Earl Gala.  I would recommend a full, dilated eye examination.  Please schedule your eye appointment as planned.  Your neurological exam is normal, MRI brain without contrast was also benign.  Please try to hydrate well with water, 6 to 8 cups/day are recommended generally speaking, 8 ounce size each.  Please limit your caffeine intake to 1 or 2 servings per day.  Continue to work on complete smoking cessation.  As discussed, we will try a medication for your headaches called Elavil (generic name: amitriptyline) 25 mg: Take half a pill daily at bedtime for one week, then one pill daily at bedtime for one week, then one and a half pills daily at bedtime for one week, then 2 pills daily at bedtime thereafter. Common side effects reported are: mouth dryness, drowsiness, confusion, dizziness.   This is taken daily to prevent headaches.  Please follow-up in about 3 months to see one of our nurse practitioners.

## 2020-04-24 NOTE — Progress Notes (Signed)
Subjective:    Patient ID: Christian Taylor is a 50 y.o. male.  HPI     Huston Foley, MD, PhD [facility] [address], [address] [address] [place], [place] [address]  Dear Dr. Docia Chuck,   I saw your patient, Christian Taylor, upon your kind request in my neurologic clinic today for initial consultation of his dizziness.  The patient is unaccompanied today.  As you know, Christian Taylor is a 50 year old right-handed gentleman with an underlying medical history of gout, hypertension, anxiety, obesity and sleep apnea, who reports an approximately 51-month history of dizziness.  He describes a lightheadedness rather than spinning sensation, no nausea or vomiting, has had intermittent headaches for the past 6 to 9 months.  Headaches are pressure-like, mostly in the frontal and bitemporal areas, sometimes he feels blurry vision in the right eye but no visual field defect, no double vision, no permanent blurry vision.  He denies any sudden onset of one-sided weakness or numbness or tingling or droopy face or slurring of speech with the exception of occasional tingling in the fingertips.  He has not fallen.  He has been trying to hydrate well, is in the process of reducing his sweet tea intake and is working on smoking cessation.  I reviewed his records but a recent office was not included.  He had recent blood work and it showed abnormally high white cell count.  Recent blood test results are not included today, we will request office notes as well as blood test results from his recent visits.  Last visit was last week on 03/20/2020 in your office. You ordered a brain MRI. He had a brain MRI without contrast on 04/03/2020 I reviewed the results: IMPRESSION: No evidence of recent infarction, hemorrhage, or mass.  He saw ENT.  He was diagnosed with a deviated septum.  He may need surgery for this.  He was discouraged from using Sudafed and encouraged to do saline rinses and use Claritin as  needed.  His sleep apnea was diagnosed 5 or 6 years ago.  He sees Dr. Earl Gala for this but has not seen him in over a year.  He is willing to make an appointment for a recheck.  He reports using his CPAP regularly.  He may have an AutoPap machine, pressure as far as he can tell is between 5 and 12, could be a set pressure of 12 starting at 5 or AutoPap with a range of 5 to 12 cm.  He is not completely sure.  He uses a full facemask.  He has no history of migraine headaches growing up.  He denies throbbing headache, associated photophobia or nausea or vomiting.  He wakes up with a headache often.  He smokes 1 to 2 cigarettes/day and drinks coffee in the morning, some sweet tea during the day but is cutting back.  He drinks alcohol 3-4 times a week.  He has not seen an eye doctor in many years.  In fact, he has looked up with an eye doctor in this area, Dr. Hubbard Robinson and is in the process of making an appointment.  He reports that his headaches were initially attributed to elevated blood pressure values.  He reports that at times he had values in the 190s over 110s. He was also advised that he had anxiety.  At 1 point vertigo was considered.  He was given a prescription for meclizine.  He does not actually use it.   His Past Medical History Is Significant  For: Past Medical History:  Diagnosis Date  . Gout     His Past Surgical History Is Significant For: No past surgical history on file.  His Family History Is Significant For: Family History  Problem Relation Age of Onset  . CAD Neg Hx     His Social History Is Significant For: Social History   Socioeconomic History  . Marital status: Married    Spouse name: Not on file  . Number of children: Not on file  . Years of education: Not on file  . Highest education level: Not on file  Occupational History  . Not on file  Tobacco Use  . Smoking status: Current Every Day Smoker    Packs/day: 0.50    Years: 17.00    Pack years: 8.50     Types: Cigarettes  . Smokeless tobacco: Never Used  Vaping Use  . Vaping Use: Never used  Substance and Sexual Activity  . Alcohol use: Yes  . Drug use: Yes    Types: Marijuana    Comment: occasionally  . Sexual activity: Not on file  Other Topics Concern  . Not on file  Social History Narrative  . Not on file   Social Determinants of Health   Financial Resource Strain: Not on file  Food Insecurity: Not on file  Transportation Needs: Not on file  Physical Activity: Not on file  Stress: Not on file  Social Connections: Not on file    His Allergies Are:  No Known Allergies:   His Current Medications Are:  Outpatient Encounter Medications as of 04/24/2020  Medication Sig  . acetaminophen (TYLENOL) 500 MG tablet Take 1,000-2,000 mg by mouth daily as needed for mild pain.  Marland Kitchen allopurinol (ZYLOPRIM) 300 MG tablet Take 600 mg by mouth daily.  . colchicine 0.6 MG tablet Take 0.6 mg by mouth daily as needed (gout).  . fluticasone (FLONASE) 50 MCG/ACT nasal spray Place into both nostrils daily.  . hydrochlorothiazide (HYDRODIURIL) 12.5 MG tablet Take 12.5 mg by mouth 2 (two) times daily.  Marland Kitchen loratadine (CLARITIN) 10 MG tablet Take 10 mg by mouth daily.  Marland Kitchen losartan (COZAAR) 25 MG tablet Take 25 mg by mouth 2 (two) times daily.   No facility-administered encounter medications on file as of 04/24/2020.  :   Review of Systems:  Out of a complete 14 point review of systems, all are reviewed and negative with the exception of these symptoms as listed below:  Review of Systems  Neurological:       Here to discuss h/a and possible vertigo. Pt reports his h/a has resolved some, but can flare up occasion.  Pt reports vertigo like sx were present more last summer and have improved. Pt had MRI back in January. Results are in epic.    Objective:  Neurological Exam  Physical Exam Physical Examination:   There were no vitals filed for this visit.  See extended vitals. No  General  Examination: The patient is a very pleasant 50 y.o. male in no acute distress. He appears well-developed and well-nourished and well groomed.   HEENT: Normocephalic, atraumatic, pupils are equal, round and reactive to light and accommodation. Funduscopic exam is normal with sharp disc margins noted. Extraocular tracking is good without limitation to gaze excursion or nystagmus noted. Normal smooth pursuit is noted. Hearing is grossly intact. Face is symmetric with normal facial animation and normal facial sensation. Speech is clear with no dysarthria noted. There is no hypophonia. There is no lip, neck/head,  jaw or voice tremor. Neck is supple with full range of passive and active motion. There are no carotid bruits on auscultation. Oropharynx exam reveals: moderate mouth dryness, adequate dental hygiene and moderate airway crowding. Tongue protrudes centrally and palate elevates symmetrically.  No dizziness, vertigo symptoms or nystagmus upon sudden head turns.  Chest: Clear to auscultation without wheezing, rhonchi or crackles noted.  Heart: S1+S2+0, regular and normal without murmurs, rubs or gallops noted.   Abdomen: Soft, non-tender and non-distended with normal bowel sounds appreciated on auscultation.  Extremities: There is no pitting edema in the distal lower extremities bilaterally. Pedal pulses are intact.  Skin: Warm and dry without trophic changes noted.  Musculoskeletal: exam reveals no obvious joint deformities, tenderness or joint swelling or erythema.   Neurologically:  Mental status: The patient is awake, alert and oriented in all 4 spheres. His immediate and remote memory, attention, language skills and fund of knowledge are appropriate. There is no evidence of aphasia, agnosia, apraxia or anomia. Speech is clear with normal prosody and enunciation. Thought process is linear. Mood is normal and affect is normal.  Cranial nerves II - XII are as described above under HEENT exam. In  addition: shoulder shrug is normal with equal shoulder height noted. Motor exam: Normal bulk, strength and tone is noted. There is no drift, tremor or rebound. Romberg is negative. Reflexes are 2+ throughout. Babinski: Toes are flexor bilaterally. Fine motor skills and coordination: intact with normal finger taps, normal hand movements, normal rapid alternating patting, normal foot taps and normal foot agility.  Cerebellar testing: No dysmetria or intention tremor on finger to nose testing. Heel to shin is unremarkable bilaterally. There is no truncal or gait ataxia.  Sensory exam: intact to light touch, vibration, temperature sense in the upper and lower extremities.  Gait, station and balance: He stands easily. No veering to one side is noted. No leaning to one side is noted. Posture is age-appropriate and stance is narrow based. Gait shows normal stride length and normal pace. No problems turning are noted. Tandem walk is unremarkable.  Assessment and Plan:   Assessment and Plan:  In summary, Christian Taylor is a very pleasant 50 y.o.-year old male with an underlying medical history of gout, hypertension, anxiety, obesity and sleep apnea, who presents for evaluation of his dizziness.  Description and examination are not telltale for vertigo.  He has had recurrent headaches for the past 6 to 9 months.  His headaches are not classic for migraines.  He may have symptoms from more than one underlying issue.  Hypertension is one concern, underlying obstructive sleep apnea is another concern.  He has not had a follow-up with his sleep specialist in some time, probably over 1 year.  He is willing to make an appointment with Dr. Earl Gala.  He may be eligible for new machine if his machine is 49 or 48 years old.  He is advised to seek a formal eye examination and he is already in the process of scheduling an eye exam as he has not had a formal evaluation in years.  Neurological exam is normal thankfully.  He had a  brain MRI without contrast which was benign as well.  He is reassured in that regard.  We talked about headache triggers.  He is advised to stay well-hydrated, well rested, and work on lifestyle modification, including smoking cessation and limiting his caffeine.  He is already working on all of this.  He is supposed to see a hematologist  as I understand for elevated white cell count.  He is advised to start low-dose amitriptyline.  He may have a component of tension headaches.  He is advised to start with 25 mg strength half a pill at bedtime with gradual titration even weekly increments to up to 50 mg at bedtime.  He is advised to call us with any interim questions or concerns and follow-up in about 3 months to see one of our nurse practitioners.   I answered all his questions today and he was in agreement.  Thank you very much for allowing me to participate in the care of this nice patient. If I can be of any further assistance to you please do not hesitate to call me at [phone].  Sincerely,   Huston FoleySaima Melinda Gwinner, MD, PhD

## 2020-05-31 ENCOUNTER — Telehealth: Payer: Self-pay | Admitting: Physician Assistant

## 2020-05-31 NOTE — Telephone Encounter (Signed)
Received a new hem referral from Dr. Docia Chuck for elevated wbc. Mr. Christian Taylor returned my call and has been scheduled to see Cassie on 3/28 at 1:30pm w/labs at 1pm. Pt aware to       arrive 20 minutes early.

## 2020-06-02 ENCOUNTER — Other Ambulatory Visit: Payer: Self-pay | Admitting: Internal Medicine

## 2020-06-02 DIAGNOSIS — D72829 Elevated white blood cell count, unspecified: Secondary | ICD-10-CM

## 2020-06-04 NOTE — Progress Notes (Signed)
[facility] [phone]) [phone]   [phone]  CONSULT NOTE  REFERRING PHYSICIAN: Dr. Docia Chuck  REASON FOR CONSULTATION:  Leukocytosis  HPI Christian Taylor is a 50 y.o. male with a past medical history significant for unstable angina, gout, sinusitis, tobacco use, obstructive sleep apnea, hypertension, and anxiety is referred to the clinic for leukocytosis.    The patient's evaluation began when he had a routine 90-month follow-up visit with his primary care provider on 05/30/2020.  He has been seeing his primary care provider for some ongoing concerns with dizziness, stress, anxiety, lightheadedness, frequent sinus infections, and nausea. He was in his usual state of health up until about 9 months ago or so until he started developing these concerns. He belives many of his medical problems started or may be related to him not using his CPAP for about a month or so. He has seen ENT who recommended Claritin and Flonase for his frequent sinus infections/deviated septum. He started flonase about 1 month ago and uses it daily. He also has seen neurology for the vertigo.  He had an MRI which was unremarkable.  The patient has had lab work showing mild leukocytosis with a CBC from 02/06/2020 showing a white blood count of 11.9, neutrophil count at 7.7, lymphocyte count elevated at 3.1, a normal hemoglobin at 16.3, and a normal platelet count at 326K.  The patient then had repeat blood work on 04/02/2020 which showed a white blood count of 12.6, hemoglobin within normal limits at 15.8, a platelet count of 313 K, and elevated neutrophil count at 3.5, and elevated lymphocyte count of 3.5.  He was subsequently referred to the clinic today for evaluation regarding these findings.  Per chart review, it appears that the patient has had evidence of intermittent leukocytosis/lymphocytosis for approximately 9 years or so, which is the oldest records available to me. The patient's white  blood cell count has ranged from 10.6K to 17.1K,  elevated neutrophils and lymphocytes.He never had a splenectomy. He denies frequent infections except for sinus infections which he states occurs monthly. He denies skin infections, dysuria, or diarrhea.   He states he has been trying to be healthier lately. He has been trying to lose weight and cut back on smoking. He lost about 15 lbs 3-4 months ago. He has been trying to eat better and walk more. He also states he was started on a medication that suppresses his appetite.  He denies any recent fevers. He denies night sweats. He denies lymphadenopathy or early satiety.  Denies significant abdominal discomfort. He has been smoking since he was 50 years old. He averages 1/2 a pack of cigarettes per day. He is trying to quit and currently is smoking 1-2 per day. The patient denies any recent steroid or prednisone use. He denies hormone use or herbal supplement use. The patient denies any history of viral infections such as HIV, hepatitis, or mono. The patient denies any history of any diagnosedautoimmune disorders.  He denies bleeding or bruising easily except for nose bleeding when he has a sinus infection. He states his fatigue is "sporatic" and comes and goes.   His family history consists of a sister who was a non-smoker who had metastatic lung cancer.  The patient's father had liver cirrhosis.  The patient's uncle had brain cancer, unclear if primary or secondary.  The patient states that he has several family members who are anemic.  He is unsure what type of anemia they have.  The  patient does not know his medical history on his mother side.  He denies any family history of any leukemia, lymphoma, or bone marrow disorders.  The patient is married and has 1 child.  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  He averages 1 alcoholic beverage per day.  He denies drug use except he will occasionally use marijuana.   Abscess previously mentioned, the patient has been smoking for approximately 26 years averaging half a pack of cigarettes per day.  He is currently trying to quit   HPI  Past Medical History:  Diagnosis Date  . Gout     No past surgical history on file.  Family History  Problem Relation Age of Onset  . CAD Neg Hx     Social History Social History   Tobacco Use  . Smoking status: Current Every Day Smoker    Packs/day: 0.50    Years: 17.00    Pack years: 8.50    Types: Cigarettes  . Smokeless tobacco: Never Used  . Tobacco comment: 1pk weekly  Vaping Use  . Vaping Use: Never used  Substance Use Topics  . Alcohol use: Yes  . Drug use: Yes    Types: Marijuana    Comment: occasionally    No Known Allergies  Current Outpatient Medications  Medication Sig Dispense Refill  . acetaminophen (TYLENOL) 500 MG tablet Take 1,000-2,000 mg by mouth daily as needed for mild pain.    Marland Kitchen allopurinol (ZYLOPRIM) 300 MG tablet Take 600 mg by mouth daily.    . colchicine 0.6 MG tablet Take 0.6 mg by mouth daily as needed (gout).    . fluticasone (FLONASE) 50 MCG/ACT nasal spray Place into both nostrils daily.    . hydrochlorothiazide (HYDRODIURIL) 12.5 MG tablet Take 12.5 mg by mouth 2 (two) times daily.    Marland Kitchen loratadine (CLARITIN) 10 MG tablet Take 10 mg by mouth daily.    Marland Kitchen losartan (COZAAR) 25 MG tablet Take 25 mg by mouth 2 (two) times daily.    . sertraline (ZOLOFT) 50 MG tablet 1 tablet     No current facility-administered medications for this visit.    REVIEW OF SYSTEMS:   Review of Systems  Constitutional: Positive for fatigue "comes and goes".  Positive for intentional weight loss. negative for appetite change, chills, and fever. HENT: Positive for frequent sinus infections.  Negative for mouth sores, sore throat and trouble swallowing.   Eyes: Negative for eye problems and icterus.  Respiratory: Negative for cough, hemoptysis, shortness of breath and wheezing.    Cardiovascular: Negative for chest pain and leg swelling.  Gastrointestinal: Negative for abdominal pain, constipation, diarrhea, nausea and vomiting.  Genitourinary: Negative for bladder incontinence, difficulty urinating, dysuria, frequency and hematuria.   Musculoskeletal: Negative for back pain, gait problem, neck pain and neck stiffness.  Skin: Negative for itching and rash.  Neurological: Negative for dizziness, extremity weakness, gait problem, headaches, light-headedness and seizures.  Hematological: Negative for adenopathy. Does not bruise/bleed easily.  Psychiatric/Behavioral: Negative for confusion, depression and sleep disturbance. The patient is not nervous/anxious.     PHYSICAL EXAMINATION:  Blood pressure 137/85, pulse (!) 59, temperature 97.9 F (36.6 C), temperature source Tympanic, resp. rate 19, height 6\' 2"  (1.88 m), weight 263 lb (119.3 kg), SpO2 99 %.  ECOG PERFORMANCE STATUS: 1 - Symptomatic but completely ambulatory  Physical Exam  Constitutional: Oriented to person, place, and time and well-developed, well-nourished, and in no distress.  HENT:  Head: Normocephalic and atraumatic.  Mouth/Throat: Oropharynx is clear and moist. No oropharyngeal exudate.  Eyes: Conjunctivae are normal. Right eye exhibits no discharge. Left eye exhibits no discharge. No scleral icterus.  Neck: Normal range of motion. Neck supple.  Cardiovascular: Normal rate, regular rhythm, normal heart sounds and intact distal pulses.   Pulmonary/Chest: Effort normal and breath sounds normal. No respiratory distress. No wheezes. No rales.  Abdominal: Soft. Bowel sounds are normal. Exhibits no distension and no mass. There is no tenderness.  Musculoskeletal: Normal range of motion. Exhibits no edema.  Lymphadenopathy:    No cervical adenopathy.  Neurological: Alert and oriented to person, place, and time. Exhibits normal muscle tone. Gait normal. Coordination normal.  Skin: Skin is warm and dry.  No rash noted. Not diaphoretic. No erythema. No pallor.  Psychiatric: Mood, memory and judgment normal.  Vitals reviewed.  LABORATORY DATA: Lab Results  Component Value Date   WBC 13.4 (H) 06/05/2020   HGB 15.7 06/05/2020   HCT 45.8 06/05/2020   MCV 86.7 06/05/2020   PLT 330 06/05/2020      Chemistry      Component Value Date/Time   NA 141 06/05/2020 1245   K 3.9 06/05/2020 1245   CL 104 06/05/2020 1245   CO2 24 06/05/2020 1245   BUN 10 06/05/2020 1245   CREATININE 0.97 06/05/2020 1245      Component Value Date/Time   CALCIUM 9.1 06/05/2020 1245   ALKPHOS 53 06/05/2020 1245   AST 23 06/05/2020 1245   ALT 30 06/05/2020 1245   BILITOT 0.5 06/05/2020 1245       RADIOGRAPHIC STUDIES: No results found.  ASSESSMENT: This is a very pleasant 50 year old Caucasian male referred to the clinic for leukocytosis.    PLAN: Patient was seen with Dr. Arbutus Ped today.  The patient several lab studies performed including CBC, CMP, LDH, and BCR/ABL.  The patient's CMP was unremarkable.  His LDH was unremarkable.  The patient CBC showed a persistent but mild elevated white blood cell count at 13.4.  His hemoglobin was within normal limits at 15.7.  His platelet count was within normal limits at 330k.  The patient's eosinophils slightly elevated and his neutrophil count is slightly elevated.   Dr. Arbutus Ped believes that the patient's leukocytosis is likely reactive secondary to his ongoing allergy issues, sinus issues, stress, and tobacco use; however, we will wait for the results of his genetic testing with BCR/ABL to rule out any myeloproliferative disorder.   I will personally call the patient with the results.  If the results are normal, no further work-up is needed and he may follow with his primary care provider.  If his results are abnormal, I will personally call the patient and arrange for follow-up appointment to further discuss his condition and further evaluation.  The patient was  congratulated on trying to quit smoking.  The patient voices understanding of current disease status and treatment options and is in agreement with the current care plan.  All questions were answered. The patient knows to call the clinic with any problems, questions or concerns. We can certainly see the patient much sooner if necessary.  Thank you so much for allowing me to participate in the care of [facility]. I will continue to follow up the patient with you and assist in his care.  Disclaimer: This note was dictated with voice recognition software. Similar sounding words can inadvertently be transcribed and may not be corrected upon review.   Cassandra L Heilingoetter  June 05, 2020, 2:14 PM   ADDENDUM: Hematology/Oncology Attending: I had a face-to-face encounter with the patient today.  I reviewed his record, labs and recommended his care plan.  This is a very pleasant 50 years old African-American male with history of smoking, obstructive sleep apnea, hypertension and anxiety.  The patient was seen by his primary care physician for routine evaluation and he was noted on blood work to have persistent leukocytosis. He was referred to us today for evaluation of this condition. We reviewed his record and back 2013 he has elevated white blood count of 17,000.  He has.  Where his count was in the normal range but up again on several occasions. The patient has no family history of blood disease or leukemia.  He has a sister who was diagnosed with metastatic lung cancer and she was a non-smoker. I had a lengthy discussion with the patient and his wife today about his current condition and further investigation to confirm diagnosis. Repeat CBC today showed white blood count of 13.4 with absolute neutrophil count of 8100.  He has normal hemoglobin, hematocrit and platelets count.  LDH was normal and comprehensive metabolic panel is unremarkable except for mild hyperglycemia. His condition  is likely reactive in nature secondary to his history of smoking as well as recurrent sinus infection and recent use of Flonase. To make sure the patient does not have any underlying myeloproliferative etiology for his leukocytosis, will order FISH study for BCR ABL. If the study is negative, his condition is likely reactive and he will continue his routine follow-up visit and evaluation by his primary care physician.  We also advised the patient to quit smoking. We will see him on as-needed basis at this point unless the molecular studies showed abnormality. He was advised to call if he has any other concerning issues in the interval. The total time spent in the appointment was 60 minutes. Disclaimer: This note was dictated with voice recognition software. Similar sounding words can inadvertently be transcribed and may be missed upon review. Lajuana MatteMohamed K Mohamed, MD 06/05/20

## 2020-06-05 ENCOUNTER — Inpatient Hospital Stay: Payer: BC Managed Care – PPO

## 2020-06-05 ENCOUNTER — Other Ambulatory Visit: Payer: Self-pay | Admitting: *Deleted

## 2020-06-05 ENCOUNTER — Other Ambulatory Visit: Payer: Self-pay

## 2020-06-05 ENCOUNTER — Inpatient Hospital Stay: Payer: BC Managed Care – PPO | Attending: Physician Assistant | Admitting: Physician Assistant

## 2020-06-05 ENCOUNTER — Other Ambulatory Visit: Payer: Self-pay | Admitting: Physician Assistant

## 2020-06-05 ENCOUNTER — Encounter: Payer: Self-pay | Admitting: Physician Assistant

## 2020-06-05 DIAGNOSIS — D72829 Elevated white blood cell count, unspecified: Secondary | ICD-10-CM | POA: Diagnosis present

## 2020-06-05 DIAGNOSIS — F419 Anxiety disorder, unspecified: Secondary | ICD-10-CM | POA: Insufficient documentation

## 2020-06-05 DIAGNOSIS — G4733 Obstructive sleep apnea (adult) (pediatric): Secondary | ICD-10-CM | POA: Diagnosis not present

## 2020-06-05 DIAGNOSIS — D649 Anemia, unspecified: Secondary | ICD-10-CM | POA: Diagnosis not present

## 2020-06-05 DIAGNOSIS — I10 Essential (primary) hypertension: Secondary | ICD-10-CM | POA: Insufficient documentation

## 2020-06-05 DIAGNOSIS — R739 Hyperglycemia, unspecified: Secondary | ICD-10-CM | POA: Insufficient documentation

## 2020-06-05 LAB — CBC WITH DIFFERENTIAL (CANCER CENTER ONLY)
Abs Immature Granulocytes: 0.05 10*3/uL (ref 0.00–0.07)
Basophils Absolute: 0.1 10*3/uL (ref 0.0–0.1)
Basophils Relative: 1 %
Eosinophils Absolute: 0.6 10*3/uL — ABNORMAL HIGH (ref 0.0–0.5)
Eosinophils Relative: 4 %
HCT: 45.8 % (ref 39.0–52.0)
Hemoglobin: 15.7 g/dL (ref 13.0–17.0)
Immature Granulocytes: 0 %
Lymphocytes Relative: 28 %
Lymphs Abs: 3.7 10*3/uL (ref 0.7–4.0)
MCH: 29.7 pg (ref 26.0–34.0)
MCHC: 34.3 g/dL (ref 30.0–36.0)
MCV: 86.7 fL (ref 80.0–100.0)
Monocytes Absolute: 0.8 10*3/uL (ref 0.1–1.0)
Monocytes Relative: 6 %
Neutro Abs: 8.1 10*3/uL — ABNORMAL HIGH (ref 1.7–7.7)
Neutrophils Relative %: 61 %
Platelet Count: 330 10*3/uL (ref 150–400)
RBC: 5.28 MIL/uL (ref 4.22–5.81)
RDW: 13.1 % (ref 11.5–15.5)
WBC Count: 13.4 10*3/uL — ABNORMAL HIGH (ref 4.0–10.5)
nRBC: 0 % (ref 0.0–0.2)

## 2020-06-05 LAB — CMP (CANCER CENTER ONLY)
ALT: 30 U/L (ref 0–44)
AST: 23 U/L (ref 15–41)
Albumin: 4.1 g/dL (ref 3.5–5.0)
Alkaline Phosphatase: 53 U/L (ref 38–126)
Anion gap: 13 (ref 5–15)
BUN: 10 mg/dL (ref 6–20)
CO2: 24 mmol/L (ref 22–32)
Calcium: 9.1 mg/dL (ref 8.9–10.3)
Chloride: 104 mmol/L (ref 98–111)
Creatinine: 0.97 mg/dL (ref 0.61–1.24)
GFR, Estimated: >60 mL/min (ref >60)
Glucose, Bld: 101 mg/dL — ABNORMAL HIGH (ref 70–99)
Potassium: 3.9 mmol/L (ref 3.5–5.1)
Sodium: 141 mmol/L (ref 135–145)
Total Bilirubin: 0.5 mg/dL (ref 0.3–1.2)
Total Protein: 7.4 g/dL (ref 6.5–8.1)

## 2020-06-05 LAB — LACTATE DEHYDROGENASE: LDH: 182 U/L (ref 98–192)

## 2020-06-09 ENCOUNTER — Telehealth: Payer: Self-pay | Admitting: Physician Assistant

## 2020-06-09 LAB — BCR ABL1 FISH (GENPATH)

## 2020-06-09 NOTE — Telephone Encounter (Signed)
I called the patient to let him know the BCR/ABL testing was normal. I was unable to reach him but left a detailed voicemail as the patient gave me permission last time I saw him in the clinic. If he had any further questions, I encouraged him to call us back. We will not schedule any follow up appointments at this time.

## 2020-08-03 ENCOUNTER — Ambulatory Visit: Payer: BC Managed Care – PPO | Admitting: Family Medicine

## 2020-10-27 ENCOUNTER — Other Ambulatory Visit: Payer: Self-pay

## 2020-10-27 ENCOUNTER — Encounter (HOSPITAL_COMMUNITY): Payer: Self-pay | Admitting: *Deleted

## 2020-10-27 ENCOUNTER — Ambulatory Visit (HOSPITAL_COMMUNITY): Payer: No Typology Code available for payment source | Attending: Emergency Medicine

## 2020-10-27 ENCOUNTER — Other Ambulatory Visit: Payer: Self-pay | Admitting: Emergency Medicine

## 2020-10-27 DIAGNOSIS — I700 Atherosclerosis of aorta: Secondary | ICD-10-CM | POA: Diagnosis not present

## 2020-10-27 DIAGNOSIS — N200 Calculus of kidney: Secondary | ICD-10-CM | POA: Insufficient documentation

## 2020-10-27 DIAGNOSIS — M549 Dorsalgia, unspecified: Secondary | ICD-10-CM | POA: Insufficient documentation

## 2020-10-27 LAB — CBC WITH DIFFERENTIAL/PLATELET
Abs Immature Granulocytes: 0.05 10*3/uL (ref 0.00–0.07)
Basophils Absolute: 0.1 10*3/uL (ref 0.0–0.1)
Basophils Relative: 1 %
Eosinophils Absolute: 0.6 10*3/uL — ABNORMAL HIGH (ref 0.0–0.5)
Eosinophils Relative: 5 %
HCT: 44.9 % (ref 39.0–52.0)
Hemoglobin: 15.1 g/dL (ref 13.0–17.0)
Immature Granulocytes: 0 %
Lymphocytes Relative: 28 %
Lymphs Abs: 3.7 10*3/uL (ref 0.7–4.0)
MCH: 29.7 pg (ref 26.0–34.0)
MCHC: 33.6 g/dL (ref 30.0–36.0)
MCV: 88.2 fL (ref 80.0–100.0)
Monocytes Absolute: 0.9 10*3/uL (ref 0.1–1.0)
Monocytes Relative: 7 %
Neutro Abs: 7.7 10*3/uL (ref 1.7–7.7)
Neutrophils Relative %: 59 %
Platelets: 326 10*3/uL (ref 150–400)
RBC: 5.09 MIL/uL (ref 4.22–5.81)
RDW: 12.9 % (ref 11.5–15.5)
WBC: 13 10*3/uL — ABNORMAL HIGH (ref 4.0–10.5)
nRBC: 0 % (ref 0.0–0.2)

## 2020-10-27 LAB — COMPREHENSIVE METABOLIC PANEL
ALT: 29 U/L (ref 0–44)
AST: 26 U/L (ref 15–41)
Albumin: 4 g/dL (ref 3.5–5.0)
Alkaline Phosphatase: 40 U/L (ref 38–126)
Anion gap: 7 (ref 5–15)
BUN: 13 mg/dL (ref 6–20)
CO2: 26 mmol/L (ref 22–32)
Calcium: 9.3 mg/dL (ref 8.9–10.3)
Chloride: 106 mmol/L (ref 98–111)
Creatinine, Ser: 1.02 mg/dL (ref 0.61–1.24)
GFR, Estimated: >60 mL/min (ref >60)
Glucose, Bld: 89 mg/dL (ref 70–99)
Potassium: 3.9 mmol/L (ref 3.5–5.1)
Sodium: 139 mmol/L (ref 135–145)
Total Bilirubin: 0.7 mg/dL (ref 0.3–1.2)
Total Protein: 6.9 g/dL (ref 6.5–8.1)

## 2020-10-27 LAB — URINALYSIS, ROUTINE W REFLEX MICROSCOPIC
Bilirubin Urine: NEGATIVE
Glucose, UA: NEGATIVE mg/dL
Ketones, ur: NEGATIVE mg/dL
Leukocytes,Ua: NEGATIVE
Nitrite: NEGATIVE
Protein, ur: NEGATIVE mg/dL
RBC / HPF: >50 RBC/hpf — ABNORMAL HIGH (ref 0–5)
Specific Gravity, Urine: 1.019 (ref 1.005–1.030)
pH: 5 (ref 5.0–8.0)

## 2020-10-27 LAB — LIPASE, BLOOD: Lipase: 33 U/L (ref 11–51)

## 2020-10-27 NOTE — ED Triage Notes (Signed)
The pt has had bloody urine for 2 days with some intermittent  abd and back pain he sometimes has voiding difficulty.  He had a c-t sacn earlier today  they called him and told him that he had multiple kidney stones and to come here right away  no pain at present no histry of kidney stones

## 2020-10-27 NOTE — ED Notes (Signed)
Pt states that he is leaving. His DR told him to come and get a CT scan STAT as an outpatient procedure. Pt requesting information regarding billing. Registration is not talking with pt. Pt stated that his DR will receive all of his results, so he will get him from his primary care provider.

## 2020-10-27 NOTE — ED Provider Notes (Signed)
Emergency Medicine Provider Triage Evaluation Note  Christian Taylor , a 50 y.o. male  was evaluated in triage.  Pt complains of back pain left side worse than right seems to have had some bloody urine was sent to urgent care due to concern for renal stones on KUB.  Also having some dysuria.  Review of Systems  Positive: Bloody urine, back pain Negative: Fever  Physical Exam  BP (!) 152/108 (BP Location: Right Arm)   Pulse 60   Temp 99.5 F (37.5 C) (Oral)   Resp 16   Ht 6\' 2"  (1.88 m)   Wt 119.7 kg   SpO2 99%   BMI 33.90 kg/m  Gen:   Awake, no distress   Resp:  Normal effort  MSK:   Moves extremities without difficulty  Other:  Left CVA tenderness  Medical Decision Making  Medically screening exam initiated at 4:28 PM.  Appropriate orders placed.  Christian Taylor was informed that the remainder of the evaluation will be completed by another provider, this initial triage assessment does not replace that evaluation, and the importance of remaining in the ED until their evaluation is complete.    Christian Taylor, DOLE 10/27/20 1636    10/29/20, MD 10/27/20 1723

## 2022-04-03 IMAGING — US US RENAL
1 series · 14 of 25 positions shown · non-contrast
Comparison: None

CLINICAL DATA: Microscopic hematuria, benign hypertension

EXAM:
RENAL / URINARY TRACT ULTRASOUND COMPLETE

[Series 1: us renal · 0.28mm/px · 53 acquisitions, 14 frames shown]
[im 1/53]
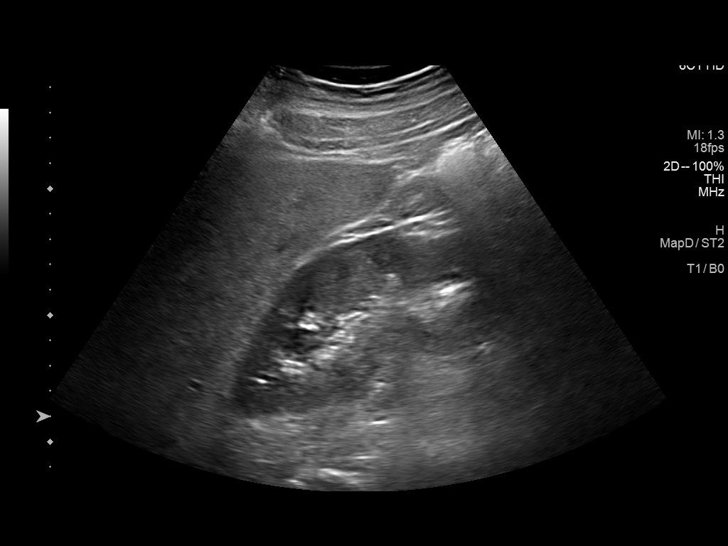
[im 5/53]
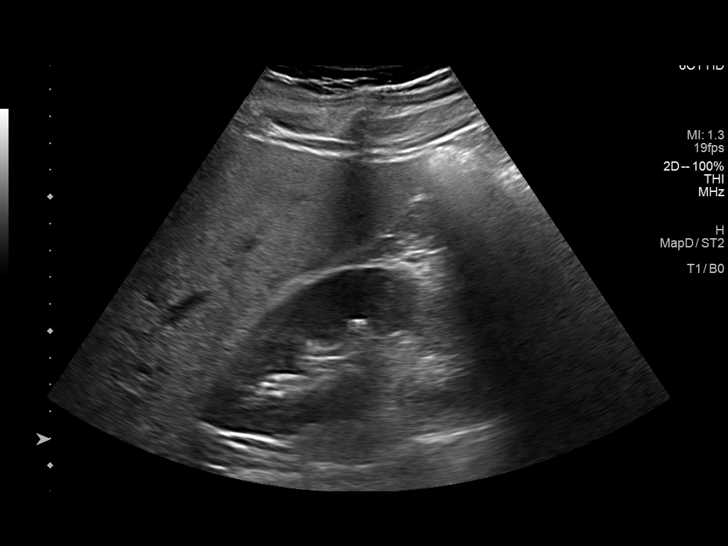
[im 9/53]
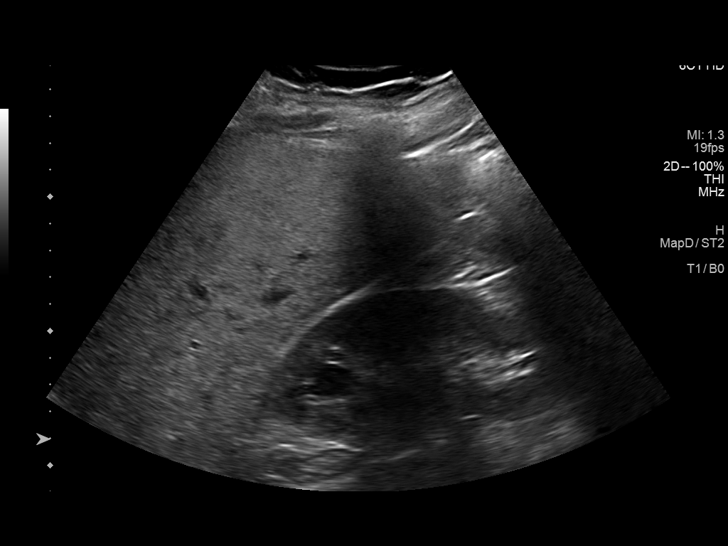
[im 14/53]
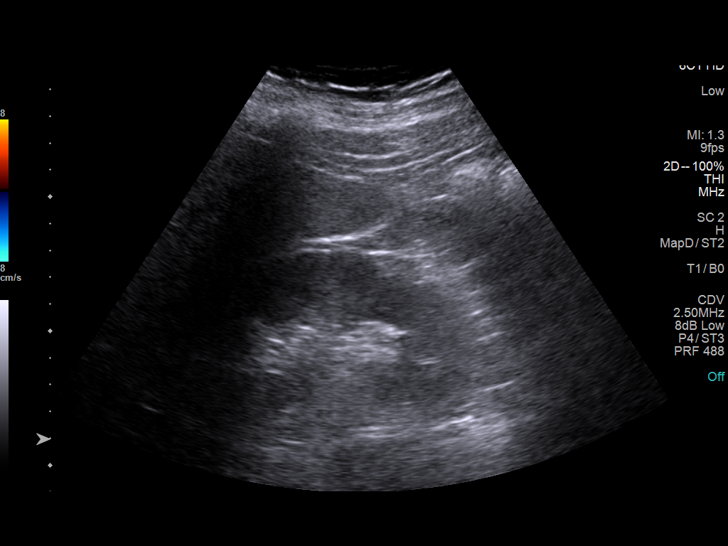
[im 18/53]
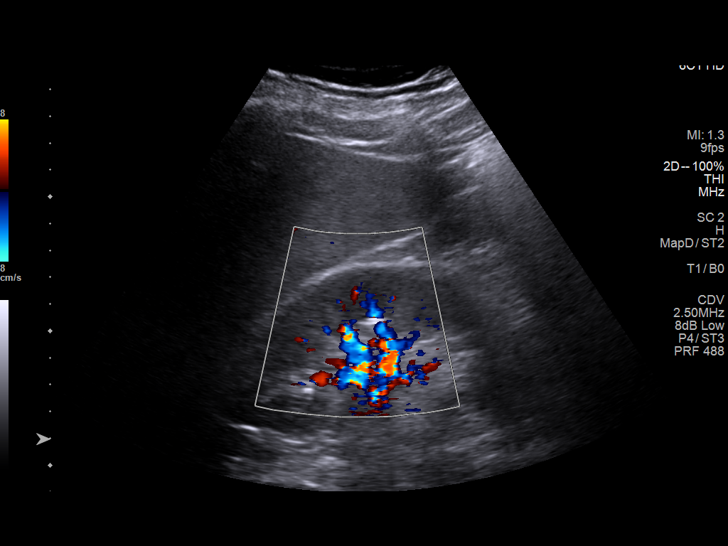
[im 20/53]
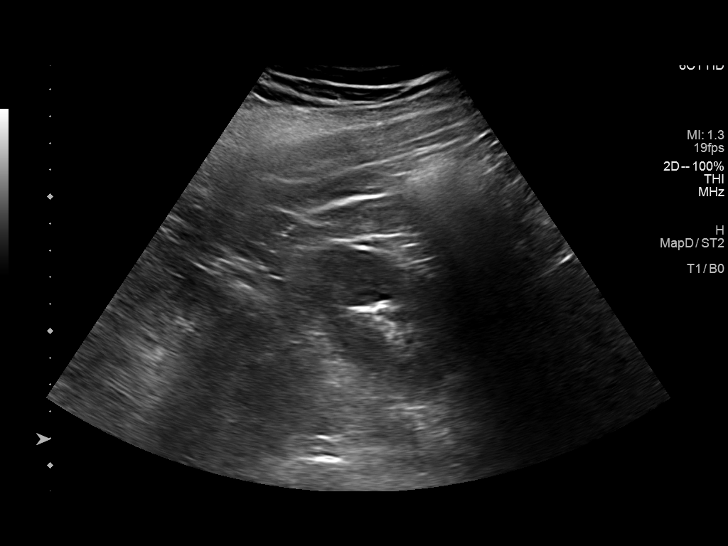
[im 24/53]
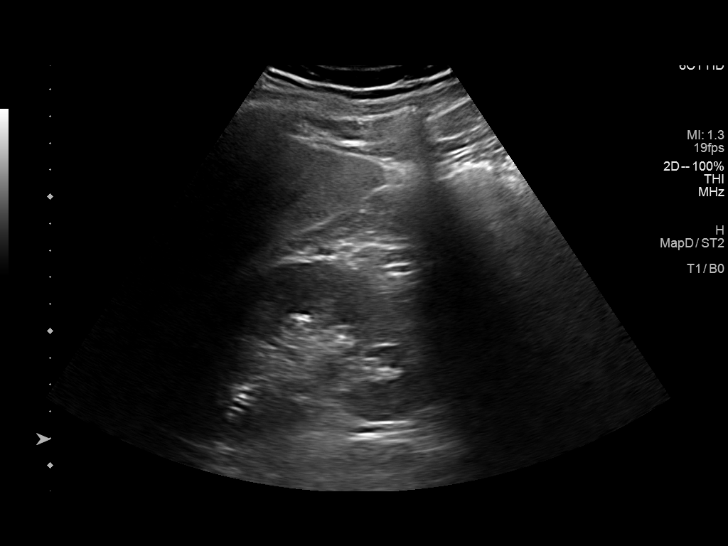
[im 29/53]
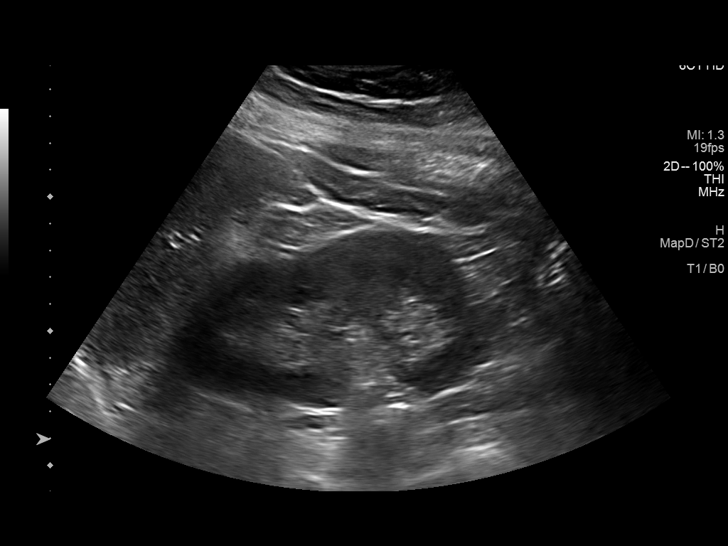
[im 33/53]
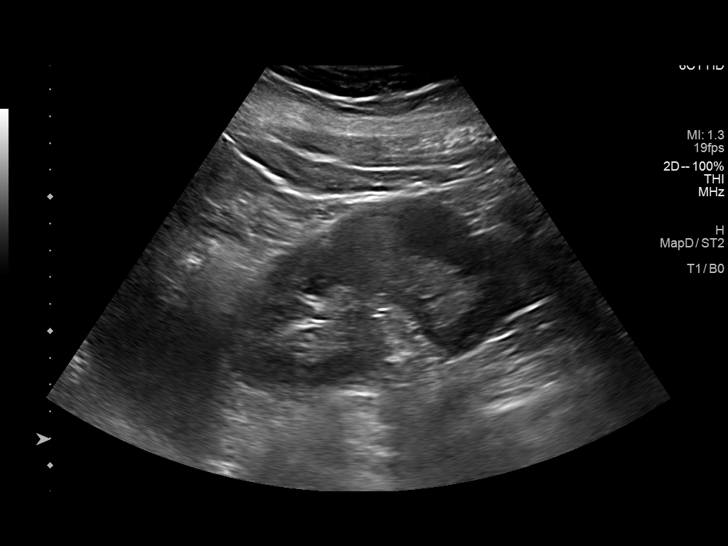
[im 35/53]
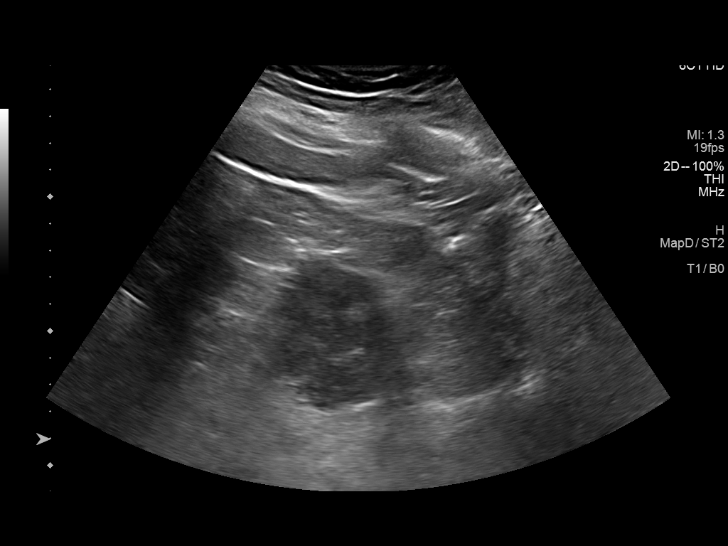
[im 40/53]
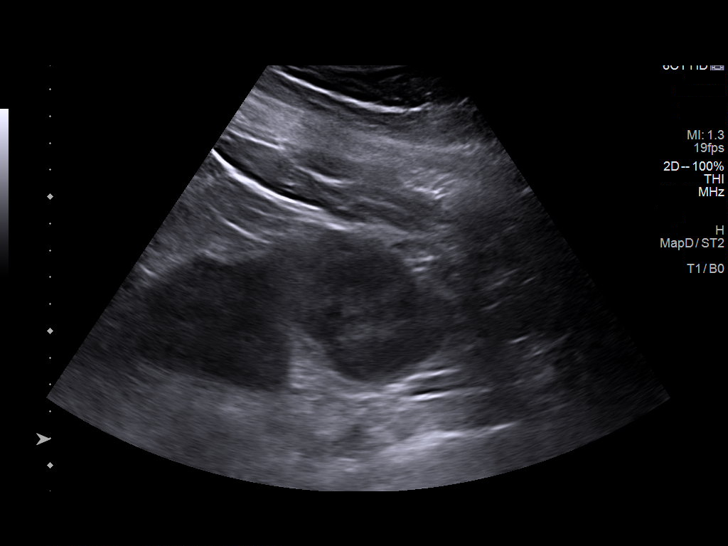
[im 44/53]
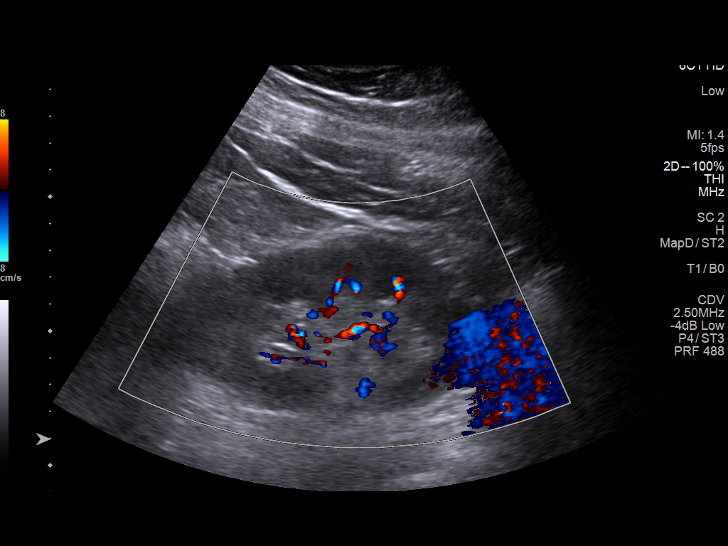
[im 48/53]
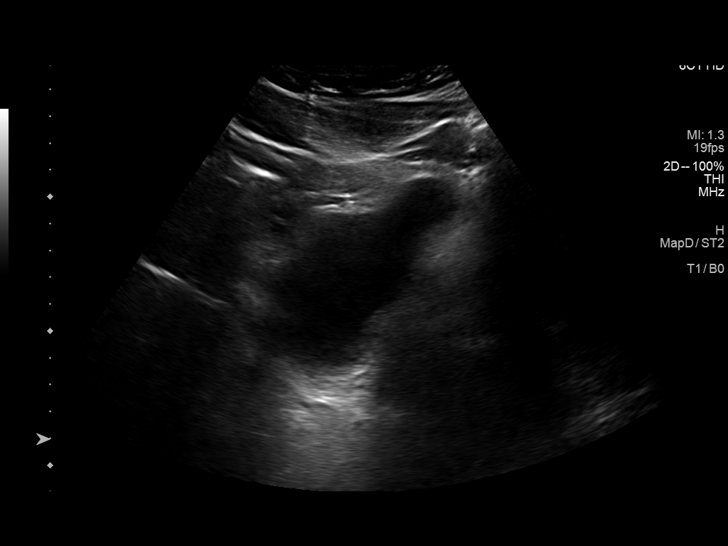
[im 53/53]
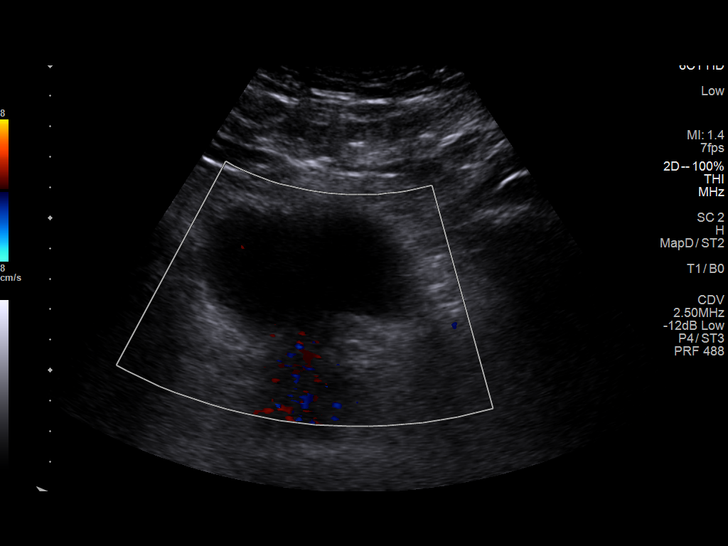

[14 of 25 positions shown; findings below may reference images not displayed]

FINDINGS: Right Kidney:

Renal measurements: 11.9 x 5.1 x 5.9 cm = volume: 189 mL. Normal
cortical thickness and echogenicity. Small cyst at upper pole 15 x
14 x 20 mm. No additional mass, hydronephrosis, or shadowing
calcification. Nonshadowing echogenic focus mid kidney, favor
artifact but a nonshadowing calculus is not completely excluded.

Left Kidney:

Renal measurements: 12.1 x 6.3 x 6.7 cm = volume: 267 mL. Normal
cortical thickness and echogenicity. No mass, hydronephrosis, or
shadowing calcification. Few nonspecific linear hyperechoic foci are
seen without shadowing.

Bladder:

Appears normal for degree of bladder distention.

Other:

N/A
IMPRESSION: 20 mm upper pole RIGHT renal cyst.

Few nonspecific linear hyperechoic foci in both kidneys without
shadowing, favor artifacts but a nonshadowing calculus in the RIGHT
kidney is not completely excluded.

Remainder of exam unremarkable.

## 2023-04-17 IMAGING — CT CT RENAL STONE PROTOCOL
2 of 4 series · 17 of 46 positions shown, 19 images · non-contrast
Comparison: None.

CLINICAL DATA: Flank pain, kidney stone suspected left greater than
right

EXAM:
CT ABDOMEN AND PELVIS WITHOUT CONTRAST
TECHNIQUE: Multidetector CT imaging of the abdomen and pelvis was performed
following the standard protocol without IV contrast.

[Series 3: stone study 5.0 i30f 2 · axial · 0.91mm/px · z∈[+864,+1344]mm · 14 of 106 slices shown, 16 images]
[im 5/106  soft-tissue]
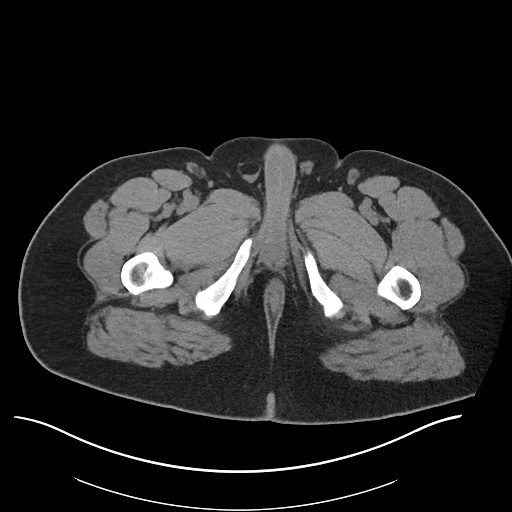
[im 5/106  bone]
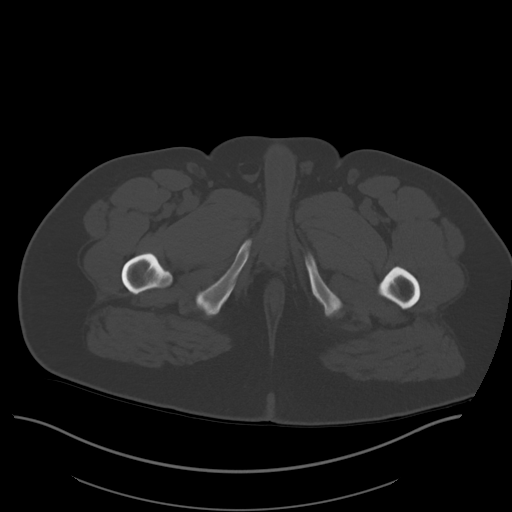
[im 14/106  soft-tissue]
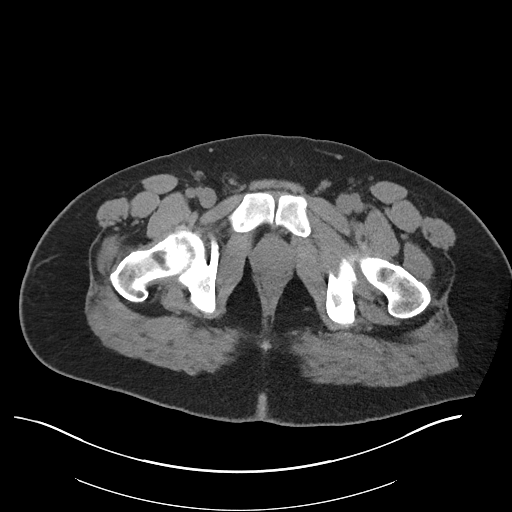
[im 22/106  soft-tissue]
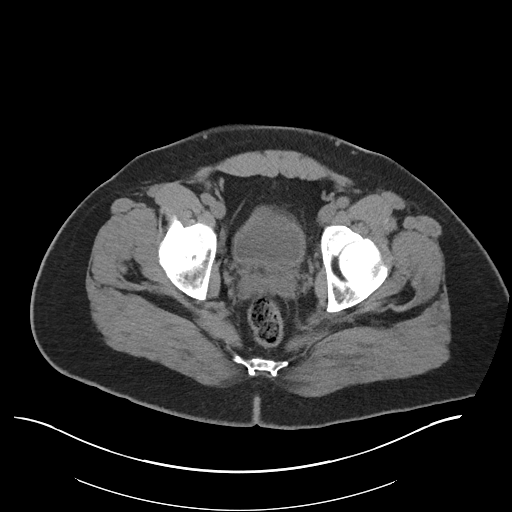
[im 27/106  soft-tissue]
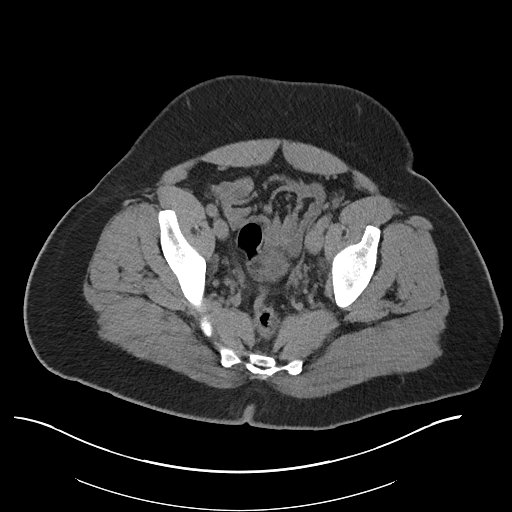
[im 36/106  soft-tissue]
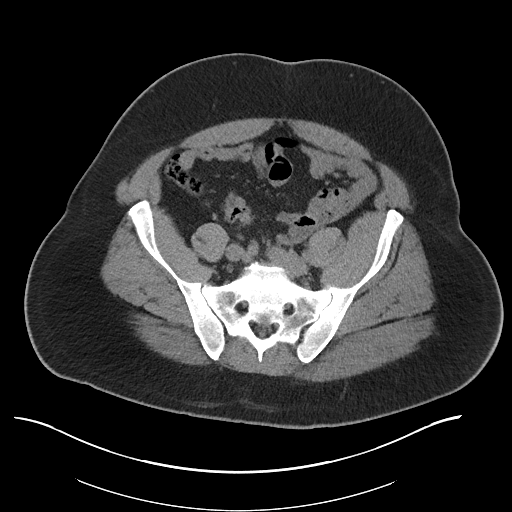
[im 44/106  soft-tissue]
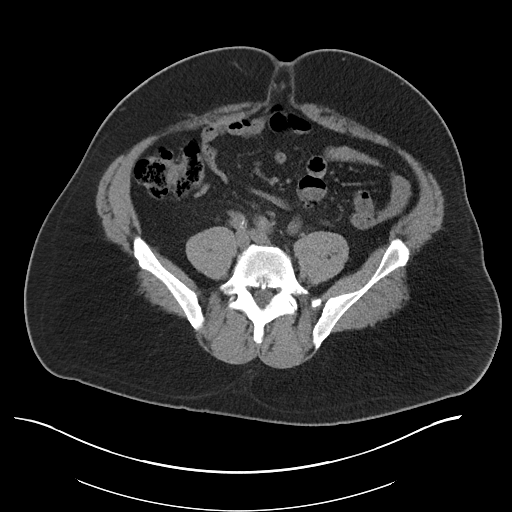
[im 49/106  soft-tissue]
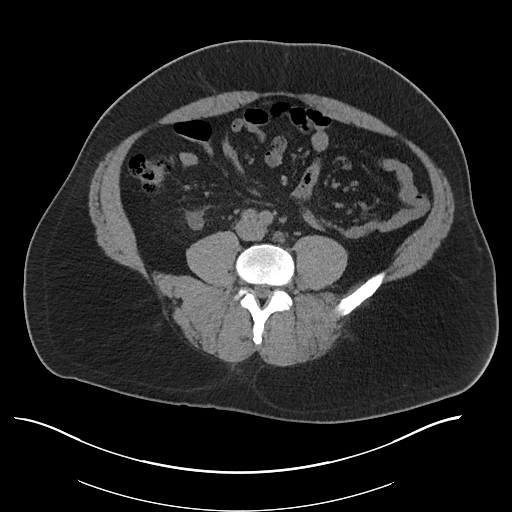
[im 57/106  soft-tissue]
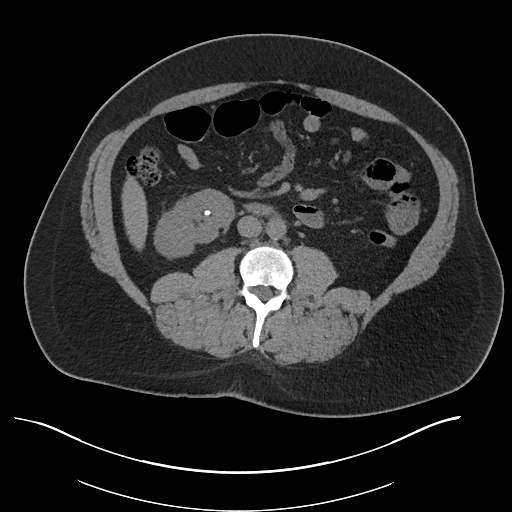
[im 62/106  soft-tissue]
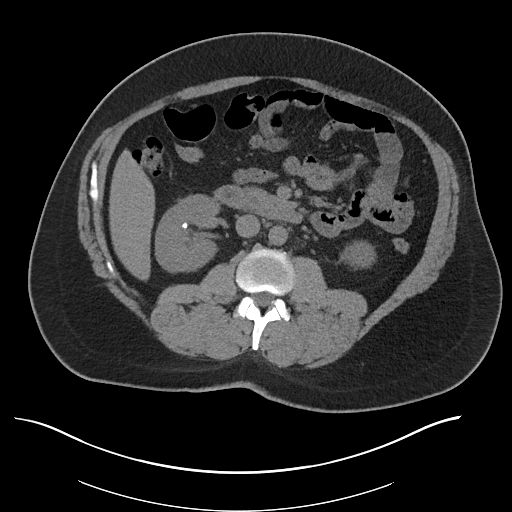
[im 62/106  bone]
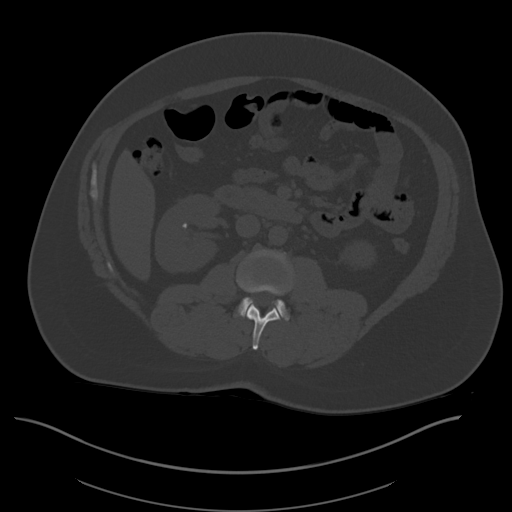
[im 71/106  soft-tissue]
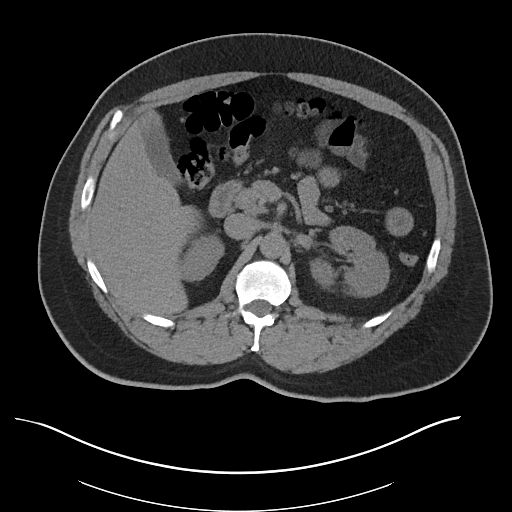
[im 79/106  soft-tissue]
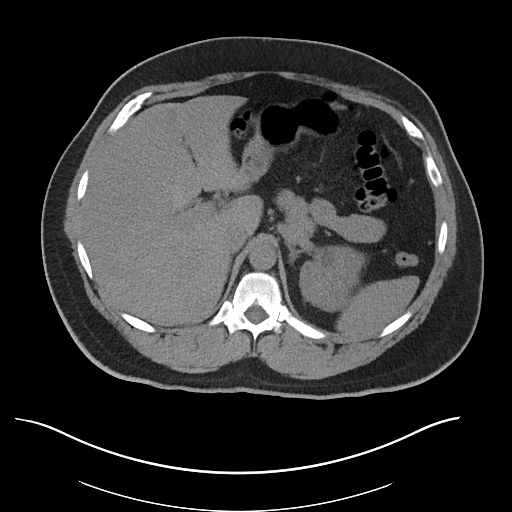
[im 84/106  soft-tissue]
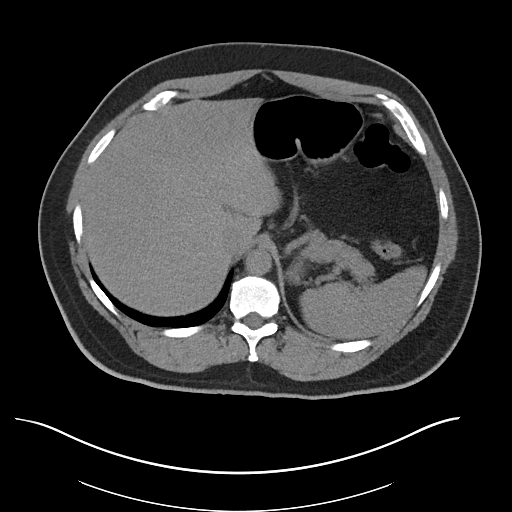
[im 92/106  soft-tissue]
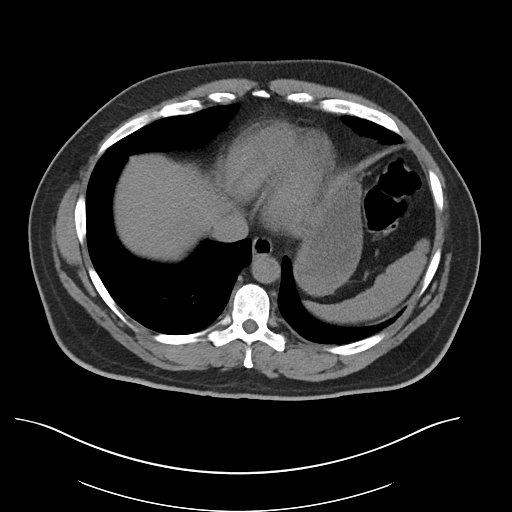
[im 101/106  soft-tissue]
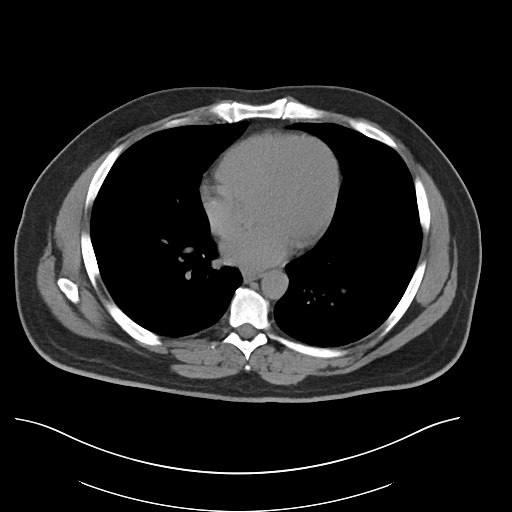

[Series 6: coronal soft tissue · coronal · 1.01mm/px · 3 of 103 slices shown]
[im 35/103  soft-tissue]
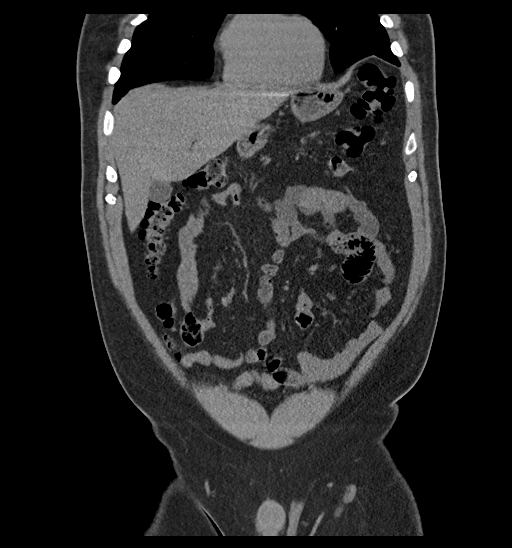
[im 46/103  soft-tissue]
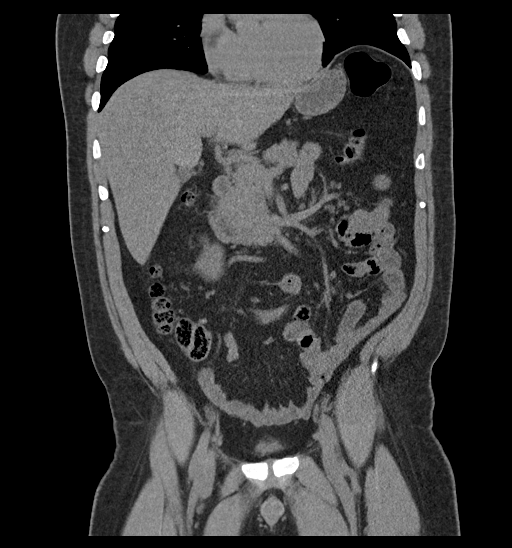
[im 57/103  soft-tissue]
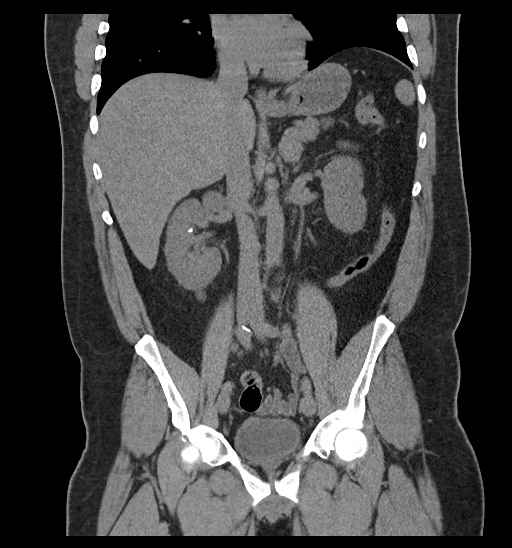

[17 of 46 positions shown; findings below may reference images not displayed]

FINDINGS: Lower chest: No acute abnormality.

Hepatobiliary: No focal liver abnormality is seen. No gallstones,
gallbladder wall thickening, or biliary dilatation.

Pancreas: Unremarkable.

Spleen: Unremarkable.

Adrenals/Urinary Tract: Adrenals are unremarkable. Bilateral too
small to characterize low-attenuation renal lesions. There are
bilateral nonobstructing renal calculi. Largest measures 6 mm at the
lower pole the right kidney. Ureters are normal in caliber. Bladder
is poorly distended but unremarkable.

Stomach/Bowel: Stomach is within normal limits. Bowel is normal in
caliber. Normal appendix.

Vascular/Lymphatic: Mild aortic atherosclerosis. No enlarged lymph
nodes.

Reproductive: Unremarkable.

Other: No free fluid.  Abdominal wall is unremarkable.

Musculoskeletal: Mild degenerative changes lower lumbar spine. No
acute osseous abnormality.
IMPRESSION: Bilateral nonobstructing renal calculi measuring up to 6 mm on the
right. No hydronephrosis.

Mild aortic atherosclerosis.

## 2023-10-15 ENCOUNTER — Telehealth: Payer: Self-pay | Admitting: Physician Assistant

## 2023-10-17 ENCOUNTER — Telehealth: Payer: Self-pay | Admitting: Physician Assistant

## 2023-10-17 NOTE — Telephone Encounter (Signed)
 Christian Taylor re-scheduled his appointment due to his work schedule.

## 2023-10-31 ENCOUNTER — Encounter: Payer: Self-pay | Admitting: Physician Assistant

## 2023-10-31 ENCOUNTER — Other Ambulatory Visit: Payer: Self-pay

## 2023-11-04 ENCOUNTER — Encounter: Payer: Self-pay | Admitting: Physician Assistant

## 2023-11-04 ENCOUNTER — Other Ambulatory Visit: Payer: Self-pay

## 2023-11-05 NOTE — Progress Notes (Unsigned)
 [facility] [phone]   [phone]) [phone]  CONSULT NOTE  REFERRING PHYSICIAN: Dr. Regino   REASON FOR CONSULTATION:  Leukocytosis   HPI Christian Taylor is a 53 y.o. male with a past medical history significant for unstable angina, gout, sinusitis, tobacco use, obstructive sleep apnea, hypertension, and anxiety is referred to the clinic for leukocytosis.   In March 2022 for leukocytosis.  His workup was reassuring at that time.  It was felt that his leukocytosis was reactive.  The patient recently had blood work performed at his PCPs office.  His blood work showed***.   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  The patient has never had a splenectomy.  He denies any frequent infections except for **.  He does experience frequent sinus infections.  He denies any skin infections, dysuria, or diarrhea.  Smoking?  Former smoker current smoker?  Try to lose weight?  He denies any recent fever, chills, night sweats, unexplained weight loss.  He denies any lymphadenopathy or early satiety.  He denies any recent steroid or prednisone use.  He denies any testosterone use.  He denies any herbal supplements.  He denies any hepatitis, HIV, or mono.  Denies any autoimmune disorders.  Denies any abnormal bleeding or bruising.  His energy is ***.  Sleep apnea on CPAP?   Still smokes.  His CPC from 7/17 showe WBC of 12.0, normal Hbg 15.8 and normal platelets of 299. Neutrophil count 7.6 and Lymphcytoes 3.2   HPI  Past Medical History:  Diagnosis Date   Gout     No past surgical history on file.  Family History  Problem Relation Age of Onset   CAD Neg Hx     Social History Social History   Tobacco Use   Smoking status: Every Day    Current packs/day: 0.50    Average packs/day: 0.5 packs/day for 17.0 years (8.5 ttl pk-yrs)    Types: Cigarettes   Smokeless tobacco: Never    Tobacco comments:    1pk weekly  Vaping Use   Vaping status: Never Used  Substance Use Topics   Alcohol use: Yes   Drug use: Yes    Types: Marijuana    Comment: occasionally    No Known Allergies  Current Outpatient Medications  Medication Sig Dispense Refill   acetaminophen  (TYLENOL ) 500 MG tablet Take 1,000-2,000 mg by mouth daily as needed for mild pain.     allopurinol (ZYLOPRIM) 300 MG tablet Take 600 mg by mouth daily.     colchicine 0.6 MG tablet Take 0.6 mg by mouth daily as needed (gout).     fluticasone (FLONASE) 50 MCG/ACT nasal spray Place into both nostrils daily.     hydrochlorothiazide  (HYDRODIURIL ) 12.5 MG tablet Take 12.5 mg by mouth 2 (two) times daily.     loratadine (CLARITIN) 10 MG tablet Take 10 mg by mouth daily.     losartan (COZAAR) 25 MG tablet Take 25 mg by mouth 2 (two) times daily.     sertraline (ZOLOFT) 50 MG tablet 1 tablet     No current facility-administered medications for this visit.    REVIEW OF SYSTEMS:   Review of Systems  Constitutional: Negative for appetite change, chills, fatigue, fever and unexpected weight change.  HENT:   Negative for mouth sores, nosebleeds, sore throat and trouble swallowing.   Eyes: Negative for eye problems and icterus.  Respiratory: Negative for cough,  hemoptysis, shortness of breath and wheezing.   Cardiovascular: Negative for chest pain and leg swelling.  Gastrointestinal: Negative for abdominal pain, constipation, diarrhea, nausea and vomiting.  Genitourinary: Negative for bladder incontinence, difficulty urinating, dysuria, frequency and hematuria.   Musculoskeletal: Negative for back pain, gait problem, neck pain and neck stiffness.  Skin: Negative for itching and rash.  Neurological: Negative for dizziness, extremity weakness, gait problem, headaches, light-headedness and seizures.  Hematological: Negative for adenopathy. Does not bruise/bleed easily.  Psychiatric/Behavioral: Negative for confusion,  depression and sleep disturbance. The patient is not nervous/anxious.     PHYSICAL EXAMINATION:  There were no vitals taken for this visit.  ECOG PERFORMANCE STATUS: {CHL ONC ECOG [id number]  Physical Exam  Constitutional: Oriented to person, place, and time and well-developed, well-nourished, and in no distress. No distress.  HENT:  Head: Normocephalic and atraumatic.  Mouth/Throat: Oropharynx is clear and moist. No oropharyngeal exudate.  Eyes: Conjunctivae are normal. Right eye exhibits no discharge. Left eye exhibits no discharge. No scleral icterus.  Neck: Normal range of motion. Neck supple.  Cardiovascular: Normal rate, regular rhythm, normal heart sounds and intact distal pulses.   Pulmonary/Chest: Effort normal and breath sounds normal. No respiratory distress. No wheezes. No rales.  Abdominal: Soft. Bowel sounds are normal. Exhibits no distension and no mass. There is no tenderness.  Musculoskeletal: Normal range of motion. Exhibits no edema.  Lymphadenopathy:    No cervical adenopathy.  Neurological: Alert and oriented to person, place, and time. Exhibits normal muscle tone. Gait normal. Coordination normal.  Skin: Skin is warm and dry. No rash noted. Not diaphoretic. No erythema. No pallor.  Psychiatric: Mood, memory and judgment normal.  Vitals reviewed.  LABORATORY DATA: Lab Results  Component Value Date   WBC 13.0 (H) 10/27/2020   HGB 15.1 10/27/2020   HCT 44.9 10/27/2020   MCV 88.2 10/27/2020   PLT 326 10/27/2020      Chemistry      Component Value Date/Time   NA 139 10/27/2020 1634   K 3.9 10/27/2020 1634   CL 106 10/27/2020 1634   CO2 26 10/27/2020 1634   BUN 13 10/27/2020 1634   CREATININE 1.02 10/27/2020 1634   CREATININE 0.97 06/05/2020 1245      Component Value Date/Time   CALCIUM 9.3 10/27/2020 1634   ALKPHOS 40 10/27/2020 1634   AST 26 10/27/2020 1634   AST 23 06/05/2020 1245   ALT 29 10/27/2020 1634   ALT 30 06/05/2020 1245   BILITOT  0.7 10/27/2020 1634   BILITOT 0.5 06/05/2020 1245       RADIOGRAPHIC STUDIES: No results found.  ASSESSMENT: This is a very pleasant 53 year old Caucasian male referred to the clinic for leukocytosis which is likely reactive.   Patient was seen with Dr. Sherrod today.  The patient previously had BCR/ABL testing which was negative.   His CBC today shows ***.   His leukocytosis is likely reactive secondary to smoking and sleep apnea.  No concerns for leukemia.  Follow-up?   The patient voices understanding of current disease status and treatment options and is in agreement with the current care plan.  All questions were answered. The patient knows to call the clinic with any problems, questions or concerns. We can certainly see the patient much sooner if necessary.  Thank you so much for allowing me to participate in the care of [facility]. I will continue to follow up the patient with you and assist in his care.  I spent {CHL  ONC TIME VISIT - DTPQU:[id number]} counseling the patient face to face. The total time spent in the appointment was {CHL ONC TIME VISIT - DTPQU:[id number]}.  Disclaimer: This note was dictated with voice recognition software. Similar sounding words can inadvertently be transcribed and may not be corrected upon review.   Chalet Kerwin L Bowe Sidor November 05, 2023, 4:11 PM

## 2023-11-06 ENCOUNTER — Inpatient Hospital Stay: Payer: Self-pay | Attending: Physician Assistant

## 2023-11-06 ENCOUNTER — Other Ambulatory Visit: Payer: Self-pay | Admitting: *Deleted

## 2023-11-06 ENCOUNTER — Other Ambulatory Visit: Payer: Self-pay | Admitting: Physician Assistant

## 2023-11-06 ENCOUNTER — Inpatient Hospital Stay: Payer: Self-pay | Admitting: Physician Assistant

## 2023-11-06 VITALS — BP 126/73 | HR 51 | Temp 97.2°F | Resp 17 | Ht 74.0 in | Wt 267.0 lb

## 2023-11-06 DIAGNOSIS — D72829 Elevated white blood cell count, unspecified: Secondary | ICD-10-CM

## 2023-11-06 LAB — LACTATE DEHYDROGENASE: LDH: 133 U/L (ref 98–192)

## 2023-11-06 LAB — CMP (CANCER CENTER ONLY)
ALT: 24 U/L (ref 0–44)
AST: 27 U/L (ref 15–41)
Albumin: 4.3 g/dL (ref 3.5–5.0)
Alkaline Phosphatase: 53 U/L (ref 38–126)
Anion gap: 6 (ref 5–15)
BUN: 15 mg/dL (ref 6–20)
CO2: 29 mmol/L (ref 22–32)
Calcium: 9.8 mg/dL (ref 8.9–10.3)
Chloride: 105 mmol/L (ref 98–111)
Creatinine: 0.92 mg/dL (ref 0.61–1.24)
GFR, Estimated: >60 mL/min (ref >60)
Glucose, Bld: 98 mg/dL (ref 70–99)
Potassium: 4.4 mmol/L (ref 3.5–5.1)
Sodium: 140 mmol/L (ref 135–145)
Total Bilirubin: 0.4 mg/dL (ref 0.0–1.2)
Total Protein: 7.1 g/dL (ref 6.5–8.1)

## 2023-11-06 LAB — CBC WITH DIFFERENTIAL (CANCER CENTER ONLY)
Abs Immature Granulocytes: 0.05 K/uL (ref 0.00–0.07)
Basophils Absolute: 0.1 K/uL (ref 0.0–0.1)
Basophils Relative: 1 %
Eosinophils Absolute: 0.4 K/uL (ref 0.0–0.5)
Eosinophils Relative: 4 %
HCT: 46.3 % (ref 39.0–52.0)
Hemoglobin: 16.1 g/dL (ref 13.0–17.0)
Immature Granulocytes: 0 %
Lymphocytes Relative: 31 %
Lymphs Abs: 3.5 K/uL (ref 0.7–4.0)
MCH: 29.8 pg (ref 26.0–34.0)
MCHC: 34.8 g/dL (ref 30.0–36.0)
MCV: 85.7 fL (ref 80.0–100.0)
Monocytes Absolute: 0.8 K/uL (ref 0.1–1.0)
Monocytes Relative: 7 %
Neutro Abs: 6.4 K/uL (ref 1.7–7.7)
Neutrophils Relative %: 57 %
Platelet Count: 280 K/uL (ref 150–400)
RBC: 5.4 MIL/uL (ref 4.22–5.81)
RDW: 13.2 % (ref 11.5–15.5)
WBC Count: 11.2 K/uL — ABNORMAL HIGH (ref 4.0–10.5)
nRBC: 0 % (ref 0.0–0.2)

## 2023-11-16 LAB — NGS JAK2 E12-15/CALR/MPL

## 2023-11-17 ENCOUNTER — Telehealth: Payer: Self-pay

## 2023-11-17 NOTE — Telephone Encounter (Signed)
 Attempted to reach patient this morning regarding results. Per Cassie, PA, the special molecular test returned normal. Elevated WBC is likely reactive to factors discussed at last appointment and is not concerning for a blood or bone marrow disorder. Patient may follow up with PCP and does not require follow up in our office.

## 2023-11-19 NOTE — Telephone Encounter (Signed)
 Spoke with patient and informed him of message below. He voiced understanding.
